# Patient Record
Sex: Male | Born: 1975 | Race: White | Hispanic: No | Smoking: Current every day smoker
Health system: Southern US, Community
[De-identification: ages and names within clinical notes are randomized; demographics above are authoritative.]

## PROBLEM LIST (undated history)

## (undated) DIAGNOSIS — F4310 Post-traumatic stress disorder, unspecified: Secondary | ICD-10-CM

## (undated) DIAGNOSIS — I10 Essential (primary) hypertension: Secondary | ICD-10-CM

## (undated) DIAGNOSIS — K219 Gastro-esophageal reflux disease without esophagitis: Secondary | ICD-10-CM

## (undated) HISTORY — PX: ELBOW SURGERY: SHX618

---

## 2010-11-25 ENCOUNTER — Emergency Department (HOSPITAL_COMMUNITY)
Admission: EM | Admit: 2010-11-25 | Discharge: 2010-11-25 | Disposition: A | Discharge status: Home / Self Care | Payer: Self-pay | Attending: Emergency Medicine | Admitting: Emergency Medicine

## 2010-11-25 DIAGNOSIS — X500XXA Overexertion from strenuous movement or load, initial encounter: Secondary | ICD-10-CM | POA: Insufficient documentation

## 2010-11-25 DIAGNOSIS — F341 Dysthymic disorder: Secondary | ICD-10-CM | POA: Insufficient documentation

## 2010-11-25 DIAGNOSIS — S335XXA Sprain of ligaments of lumbar spine, initial encounter: Secondary | ICD-10-CM | POA: Insufficient documentation

## 2010-11-25 DIAGNOSIS — M545 Low back pain, unspecified: Secondary | ICD-10-CM | POA: Insufficient documentation

## 2010-11-25 DIAGNOSIS — Z79899 Other long term (current) drug therapy: Secondary | ICD-10-CM | POA: Insufficient documentation

## 2011-11-30 ENCOUNTER — Emergency Department (HOSPITAL_COMMUNITY)
Admission: EM | Admit: 2011-11-30 | Discharge: 2011-11-30 | Disposition: A | Discharge status: Home / Self Care | Payer: Medicaid Other | Attending: Emergency Medicine | Admitting: Emergency Medicine

## 2011-11-30 ENCOUNTER — Emergency Department (HOSPITAL_COMMUNITY): Payer: Medicaid Other

## 2011-11-30 ENCOUNTER — Other Ambulatory Visit: Payer: Self-pay

## 2011-11-30 ENCOUNTER — Encounter (HOSPITAL_COMMUNITY): Payer: Self-pay

## 2011-11-30 DIAGNOSIS — Z79899 Other long term (current) drug therapy: Secondary | ICD-10-CM | POA: Insufficient documentation

## 2011-11-30 DIAGNOSIS — R4182 Altered mental status, unspecified: Secondary | ICD-10-CM | POA: Insufficient documentation

## 2011-11-30 DIAGNOSIS — F22 Delusional disorders: Secondary | ICD-10-CM | POA: Insufficient documentation

## 2011-11-30 LAB — RAPID URINE DRUG SCREEN, HOSP PERFORMED
Amphetamines: NOT DETECTED
Barbiturates: NOT DETECTED
Benzodiazepines: NOT DETECTED
Cocaine: NOT DETECTED
Opiates: NOT DETECTED
Tetrahydrocannabinol: NOT DETECTED

## 2011-11-30 LAB — CBC
HCT: 44.3 % (ref 39.0–52.0)
Hemoglobin: 15.5 g/dL (ref 13.0–17.0)
MCH: 33.8 pg (ref 26.0–34.0)
MCHC: 35 g/dL (ref 30.0–36.0)
MCV: 96.7 fL (ref 78.0–100.0)
Platelets: 177 K/uL (ref 150–400)
RBC: 4.58 MIL/uL (ref 4.22–5.81)
RDW: 12.8 % (ref 11.5–15.5)
WBC: 7.4 K/uL (ref 4.0–10.5)

## 2011-11-30 LAB — DIFFERENTIAL
Basophils Absolute: 0 10*3/uL (ref 0.0–0.1)
Basophils Relative: 0 % (ref 0–1)
Neutro Abs: 4.4 10*3/uL (ref 1.7–7.7)
Neutrophils Relative %: 59 % (ref 43–77)

## 2011-11-30 LAB — ETHANOL: Alcohol, Ethyl (B): 223 mg/dL — ABNORMAL HIGH (ref 0–11)

## 2011-11-30 LAB — COMPREHENSIVE METABOLIC PANEL
ALT: 57 U/L — ABNORMAL HIGH (ref 0–53)
AST: 60 U/L — ABNORMAL HIGH (ref 0–37)
Albumin: 4.3 g/dL (ref 3.5–5.2)
Alkaline Phosphatase: 116 U/L (ref 39–117)
Chloride: 105 mEq/L (ref 96–112)
Potassium: 4 mEq/L (ref 3.5–5.1)
Total Bilirubin: 0.2 mg/dL — ABNORMAL LOW (ref 0.3–1.2)

## 2011-11-30 LAB — AMMONIA: Ammonia: 28 umol/L (ref 11–60)

## 2011-11-30 LAB — URINALYSIS, ROUTINE W REFLEX MICROSCOPIC
Glucose, UA: NEGATIVE mg/dL
Hgb urine dipstick: NEGATIVE
Ketones, ur: NEGATIVE mg/dL
Protein, ur: NEGATIVE mg/dL

## 2011-11-30 LAB — ACETAMINOPHEN LEVEL: Acetaminophen (Tylenol), Serum: <15 ug/mL (ref 10–30)

## 2011-11-30 LAB — LACTIC ACID, PLASMA: Lactic Acid, Venous: 1.5 mmol/L (ref 0.5–2.2)

## 2011-11-30 MED ORDER — PANTOPRAZOLE SODIUM 40 MG PO TBEC
40.0000 mg | DELAYED_RELEASE_TABLET | Freq: Every day | ORAL | Status: DC
  Filled 2011-11-30: qty 1

## 2011-11-30 MED ORDER — ADULT MULTIVITAMIN W/MINERALS CH
1.0000 | ORAL_TABLET | Freq: Every day | ORAL | Status: DC
  Administered 2011-11-30: 1 via ORAL
  Filled 2011-11-30: qty 1

## 2011-11-30 MED ORDER — LORAZEPAM 1 MG PO TABS
1.0000 mg | ORAL_TABLET | Freq: Four times a day (QID) | ORAL | Status: DC | PRN
  Administered 2011-11-30 (×2): 1 mg via ORAL
  Filled 2011-11-30: qty 1

## 2011-11-30 MED ORDER — LORAZEPAM 2 MG/ML IJ SOLN: 1.0000 mg | Freq: Four times a day (QID) | INTRAMUSCULAR | Status: DC | PRN

## 2011-11-30 MED ORDER — FOLIC ACID 1 MG PO TABS
1.0000 mg | ORAL_TABLET | Freq: Every day | ORAL | Status: DC
  Administered 2011-11-30: 1 mg via ORAL
  Filled 2011-11-30: qty 1

## 2011-11-30 MED ORDER — ONDANSETRON HCL 8 MG PO TABS: 4.0000 mg | ORAL_TABLET | Freq: Three times a day (TID) | ORAL | Status: DC | PRN

## 2011-11-30 MED ORDER — IBUPROFEN 200 MG PO TABS: 600.0000 mg | ORAL_TABLET | Freq: Three times a day (TID) | ORAL | Status: DC | PRN

## 2011-11-30 MED ORDER — PAROXETINE HCL 20 MG PO TABS
20.0000 mg | ORAL_TABLET | Freq: Two times a day (BID) | ORAL | Status: DC
  Administered 2011-11-30 (×2): 20 mg via ORAL
  Filled 2011-11-30 (×2): qty 1

## 2011-11-30 MED ORDER — ATENOLOL 100 MG PO TABS
100.0000 mg | ORAL_TABLET | Freq: Every day | ORAL | Status: DC
  Administered 2011-11-30: 100 mg via ORAL
  Filled 2011-11-30: qty 1

## 2011-11-30 MED ORDER — VITAMIN B-1 100 MG PO TABS
100.0000 mg | ORAL_TABLET | Freq: Every day | ORAL | Status: DC
  Administered 2011-11-30: 100 mg via ORAL
  Filled 2011-11-30: qty 1

## 2011-11-30 MED ORDER — THIAMINE HCL 100 MG/ML IJ SOLN: 100.0000 mg | Freq: Every day | INTRAMUSCULAR | Status: DC

## 2011-11-30 MED ORDER — CLONAZEPAM 0.5 MG PO TABS
0.5000 mg | ORAL_TABLET | Freq: Three times a day (TID) | ORAL | Status: DC
  Administered 2011-11-30: 0.5 mg via ORAL
  Filled 2011-11-30: qty 1

## 2011-11-30 MED ORDER — LORAZEPAM 1 MG PO TABS: 0.0000 mg | ORAL_TABLET | Freq: Two times a day (BID) | ORAL | Status: DC

## 2011-11-30 MED ORDER — LORAZEPAM 1 MG PO TABS
0.0000 mg | ORAL_TABLET | Freq: Four times a day (QID) | ORAL | Status: DC
  Administered 2011-11-30: 2 mg via ORAL
  Administered 2011-11-30: 1 mg via ORAL
  Filled 2011-11-30: qty 2

## 2011-11-30 MED ORDER — NICOTINE 21 MG/24HR TD PT24
21.0000 mg | MEDICATED_PATCH | Freq: Once | TRANSDERMAL | Status: DC
  Administered 2011-11-30: 21 mg via TRANSDERMAL
  Filled 2011-11-30: qty 1

## 2011-11-30 MED ORDER — LORAZEPAM 1 MG PO TABS: 1.0000 mg | ORAL_TABLET | Freq: Three times a day (TID) | ORAL | Status: DC | PRN

## 2011-11-30 NOTE — ED Notes (Signed)
Pt very agitated, stating that he wants to leave AMA and continues to pace around the room.  Sitter at bedside.  Pt and family offered drinks which I took to them.  Offered pt Ativan which he readily accepted.
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
## 2011-11-30 NOTE — ED Notes (Signed)
Pt presents with 2 month h/o "reality" differences.  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  Pt denies any hallucinations.  Pt denies any SI/HI but fiancee reports on Tuesday, pt took a handful of his pills reports he'd be better off dead.

## 2011-11-30 NOTE — ED Provider Notes (Addendum)
BP 138/81  Pulse 112  Temp(Src) 98.2 F (36.8 C) (Oral)  Resp 16  Ht 6' (1.829 m)  Wt 218 lb (98.884 kg)  BMI 29.57 kg/m2  SpO2 92%  Reviewed telepsych. Recommend discharge home. Continue clonazepam and paxil as prescribed. Will recheck vitals prior to discharge.  ACT to give outpatient resources.  Forbes Cellar, MD 11/30/11 2131  BP 134/87  Pulse 86  Temp(Src) 97.6 F (36.4 C) (Oral)  Resp 20  Ht 6' (1.829 m)  Wt 218 lb (98.884 kg)  BMI 29.57 kg/m2  SpO2 100%   Forbes Cellar, MD 11/30/11 2159

## 2011-11-30 NOTE — ED Provider Notes (Signed)
History     CSN: [id number]  Arrival date & time 11/30/11  1157   First MD Initiated Contact with Patient 11/30/11 1245      Chief Complaint  Patient presents with  . 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 Patient denies any hallucinations, hearing voices, SI or HI. He has a history of anxiety, depression and PTSD and sees a Veterinary surgeon. Takes Paxil and Klonopin. He denies any street drug use. He takes alcohol daily last drink just before coming in. He does get shaky if he does not drink. He denies any chest pain, shortness of breath, abdominal pain, nausea vomiting or fever. Is alert and oriented x3. The patient's fianc reports that he try to overdose on a handful of pills 2 days ago.  The history is provided by the patient and the spouse.    History reviewed. No pertinent past medical history.  Past Surgical History  Procedure Date  . Elbow surgery     No family history on file.  History  Substance Use Topics  . Smoking status: Current Everyday Smoker -- 1.0 packs/day  . Smokeless tobacco: Not on file  . Alcohol Use: Yes      Review of Systems  Constitutional: Positive for activity change and appetite change. Negative for fever.  HENT: Negative for congestion and rhinorrhea.   Eyes: Negative for visual disturbance.  Respiratory: Negative for cough and shortness of breath.   Cardiovascular: Negative for chest pain.  Gastrointestinal: Negative for nausea, vomiting and abdominal pain.  Genitourinary: Negative for dysuria and hematuria.  Musculoskeletal: Negative for back pain.  Neurological: Negative for dizziness, weakness and headaches.  Psychiatric/Behavioral: Positive for suicidal ideas, behavioral problems, confusion, sleep  disturbance, decreased concentration and altered mental status. The patient is nervous/anxious.     Allergies  Review of patient's allergies indicates no known allergies.  Home Medications   Current Outpatient Rx  Name Route Sig Dispense Refill  . ATENOLOL 100 MG PO TABS Oral Take 100 mg by mouth daily.    Marland Kitchen CLONAZEPAM 0.5 MG PO TABS Oral Take 0.5 mg by mouth 3 (three) times daily.    . CYCLOBENZAPRINE HCL 10 MG PO TABS Oral Take 10 mg by mouth daily.    Marland Kitchen OMEPRAZOLE 20 MG PO CPDR Oral Take 20 mg by mouth daily.    Marland Kitchen PAROXETINE HCL 20 MG PO TABS Oral Take 20 mg by mouth 2 (two) times daily.      BP 134/87  Pulse 86  Temp(Src) 97.6 F (36.4 C) (Oral)  Resp 20  Ht 6' (1.829 m)  Wt 218 lb (98.884 kg)  BMI 29.57 kg/m2  SpO2 100%  Physical Exam  Constitutional: He is oriented to person, place, and time. He appears well-developed and well-nourished. No distress.  HENT:  Head: Normocephalic and atraumatic.  Mouth/Throat: Oropharynx is clear and moist. No oropharyngeal exudate.  Eyes: Conjunctivae are normal. Pupils are equal, round, and reactive to light.  Neck: Normal range of motion. Neck supple.       No meningismus  Cardiovascular: Normal rate, regular rhythm and normal heart sounds.   Pulmonary/Chest: Effort normal. No respiratory distress.  Abdominal: Soft. There is no tenderness. There is no rebound and no guarding.  Musculoskeletal: Normal range of motion. He exhibits no edema and no tenderness.  Neurological: He is alert and  oriented to person, place, and time. No cranial nerve deficit.  Skin: Skin is warm.    ED Course  Procedures (including critical care time)  Labs Reviewed  COMPREHENSIVE METABOLIC PANEL - Abnormal; Notable for the following:    AST 60 (*)    ALT 57 (*)    Total Bilirubin 0.2 (*)    All other components within normal limits  ETHANOL - Abnormal; Notable for the following:    Alcohol, Ethyl (B) 223 (*)    All other components within normal  limits  SALICYLATE LEVEL - Abnormal; Notable for the following:    Salicylate Lvl <2.0 (*)    All other components within normal limits  CBC  DIFFERENTIAL  URINALYSIS, ROUTINE W REFLEX MICROSCOPIC  URINE RAPID DRUG SCREEN (HOSP PERFORMED)  AMMONIA  LACTIC ACID, PLASMA  ACETAMINOPHEN LEVEL  LAB REPORT - SCANNED   Ct Head Wo Contrast  11/30/2011  *RADIOLOGY REPORT*  Clinical Data: Altered mental status  CT HEAD WITHOUT CONTRAST  Technique:  Contiguous axial images were obtained from the base of the skull through the vertex without contrast.  Comparison: None  Findings: Prominent cisterna magna, normal variant. Normal ventricular morphology. No midline shift or mass effect. Otherwise normal appearance of brain parenchyma. No intracranial hemorrhage, mass lesion or evidence of acute infarction. No extra-axial fluid collections. Visualized paranasal sinuses and mastoid air cells clear. No acute osseous findings.  IMPRESSION: No acute intracranial abnormalities.  Original Report Authenticated By: Lollie Marrow, M.D.     1. Delusions       MDM  Mental status change with hallucinations, delusions. No SI or HI. Alcohol abuse, PTSD, recent suicidal gesture. Medical workup without etiology of symptoms. ACT team and telepsych consult.     Date: 11/30/2011  Rate: 93  Rhythm: normal sinus rhythm  QRS Axis: normal  Intervals: normal  ST/T Wave abnormalities: normal  Conduction Disutrbances:none  Narrative Interpretation: LVH, QTc normal  Old EKG Reviewed: none available      Glynn Octave, MD 12/01/11 1157
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
## 2011-11-30 NOTE — ED Notes (Signed)
Pt and family are both expressing aggravation at the fact that they have been here for 5 hours and are still awaiting video conference via telepsyche.  Spoke with the ACT team member Belenda Cruise, who advised that it should occur shortly and the  computer was moved to the pt['s room.

## 2011-11-30 NOTE — ED Notes (Signed)
Dinner tray ordered, regular nonsharp 

## 2011-11-30 NOTE — Discharge Instructions (Signed)
Take your medications as prescribed. Follow up with your doctors as discussed.  RESOURCE GUIDE  Dental Problems  Patients with Medicaid: [facility]                     [facility] [address]                                           [address] Phone:  [phone]                                                   Phone:  [phone]  If unable to pay or uninsured, contact:  [facility] or [facility] to become qualified for the adult dental clinic.  Chronic Pain Problems Contact [facility]  [phone] Patients need to be referred by their primary care doctor.  Insufficient Money for Medicine Contact [employer]:  call "211" or [facility] 271-5999.  No Primary Care Doctor Call [facility]  [phone] Other agencies that provide inexpensive medical care    [facility]  [phone]    [facility]  [phone]    [facility]  [phone]    [facility]  [phone]    [facility]  [phone]    [facility]  [phone]  Psychological Services [facility]  [phone] [facility]  [phone] [facility]   [phone] (emergency services 641-4993)  Abuse/Neglect [place] Child Abuse Hotline (336) 641-3795 [place] Child Abuse Hotline 800-378-5315 (After Hours)  Emergency Shelter [facility] (336) 271-5985  [facility] (336) 275-9566 [facility] (704) 372-4663  MRSA Hotline #:   832-7006    [place] [facility]  [facility] [place]  [employer]                           [facility] [address] [place]                     [address]         [address]  [place]                                               [place]                              [place] Phone:  [phone]                                   Phone:  [phone]                   Phone:  [phone]  [facility] Phone:  [phone]  [place] Child Abuse Hotline (336) 342-1394 (336) 342-3537 (After Hours)  

## 2011-11-30 NOTE — ED Notes (Signed)
Spoke with Candy, House Coverage and she is informed about this pt

## 2011-11-30 NOTE — ED Notes (Signed)
Pt is A/O x4, NAD and no vernal complaints at this time.

## 2011-11-30 NOTE — ED Notes (Signed)
Dinner trays delivered

## 2011-11-30 NOTE — ED Notes (Signed)
Patient states that he is here because his girlfriend and his mother brought him to the ED.  Pt denies any suicidal thoughts at this time. Pt denies any homicidal thoughts.  Pt is cooperative with RN and conversing.  Pt states that he does drink ETOH every day. Patient states that she drinks a 6 pack of beer every day.  Last drink was this morning prior to arrival.

## 2012-01-23 ENCOUNTER — Emergency Department (HOSPITAL_COMMUNITY)
Admission: EM | Admit: 2012-01-23 | Discharge: 2012-01-24 | Disposition: A | Discharge status: Home / Self Care | Payer: Medicaid Other | Attending: Emergency Medicine | Admitting: Emergency Medicine

## 2012-01-23 ENCOUNTER — Emergency Department (HOSPITAL_COMMUNITY): Payer: Medicaid Other

## 2012-01-23 ENCOUNTER — Encounter (HOSPITAL_COMMUNITY): Payer: Self-pay | Admitting: Emergency Medicine

## 2012-01-23 DIAGNOSIS — F17200 Nicotine dependence, unspecified, uncomplicated: Secondary | ICD-10-CM | POA: Insufficient documentation

## 2012-01-23 DIAGNOSIS — IMO0002 Reserved for concepts with insufficient information to code with codable children: Secondary | ICD-10-CM | POA: Insufficient documentation

## 2012-01-23 DIAGNOSIS — I10 Essential (primary) hypertension: Secondary | ICD-10-CM | POA: Insufficient documentation

## 2012-01-23 DIAGNOSIS — S2239XA Fracture of one rib, unspecified side, initial encounter for closed fracture: Secondary | ICD-10-CM | POA: Insufficient documentation

## 2012-01-23 DIAGNOSIS — R079 Chest pain, unspecified: Secondary | ICD-10-CM | POA: Insufficient documentation

## 2012-01-23 HISTORY — DX: Gastro-esophageal reflux disease without esophagitis: K21.9

## 2012-01-23 HISTORY — DX: Essential (primary) hypertension: I10

## 2012-01-23 HISTORY — DX: Post-traumatic stress disorder, unspecified: F43.10
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
## 2012-01-24 MED ORDER — HYDROCODONE-ACETAMINOPHEN 5-325 MG PO TABS
1.0000 | ORAL_TABLET | Freq: Once | ORAL | Status: AC
  Administered 2012-01-24: 1 via ORAL
  Filled 2012-01-24: qty 1

## 2012-01-24 MED ORDER — HYDROCODONE-ACETAMINOPHEN 5-500 MG PO TABS: 1.0000 | ORAL_TABLET | Freq: Four times a day (QID) | ORAL | Status: AC | PRN

## 2012-01-24 NOTE — ED Provider Notes (Signed)
Medical screening examination/treatment/procedure(s) were performed by non-physician practitioner and as supervising physician I was immediately available for consultation/collaboration.    Vida Roller, MD 01/24/12 [phone]
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
## 2012-01-24 NOTE — Discharge Instructions (Signed)
Rib Fracture Your caregiver has diagnosed you as having a rib fracture (a break). This can occur by a blow to the chest, by a fall against a hard object, or by violent coughing or sneezing. There may be one or many breaks. Rib fractures may heal on their own within 3 to 8 weeks. The longer healing period is usually associated with a continued cough or other aggravating activities. HOME CARE INSTRUCTIONS   Avoid strenuous activity. Be careful during activities and avoid bumping the injured rib. Activities that cause pain pull on the fracture site(s) and are best avoided if possible.   Eat a normal, well-balanced diet. Drink plenty of fluids to avoid constipation.   Take deep breaths several times a day to keep lungs free of infection. Try to cough several times a day, splinting the injured area with a pillow. This will help prevent pneumonia.   Do not wear a rib belt or binder. These restrict breathing which can lead to pneumonia.   Only take over-the-counter or prescription medicines for pain, discomfort, or fever as directed by your caregiver.  SEEK MEDICAL CARE IF:  You develop a continual cough, associated with thick or bloody sputum. SEEK IMMEDIATE MEDICAL CARE IF:   You have a fever.   You have difficulty breathing.   You have nausea (feeling sick to your stomach), vomiting, or abdominal (belly) pain.   You have worsening pain, not controlled with medications.  Document Released: 08/14/2005 Document Revised: 08/03/2011 Document Reviewed: 01/16/2007 [facility] Patient Information 2012 [place], [place]. You have anterior left sixth rib fracture that is not displaced You have  been given pain medication, as well as an incentive spirometer.  Please use this as instructed

## 2013-04-18 IMAGING — CT CT HEAD W/O CM
1 of 2 series · 13 of 30 positions shown, 17 images · non-contrast
Comparison: None

CLINICAL DATA: Altered mental status

CT HEAD WITHOUT CONTRAST
TECHNIQUE: Contiguous axial images were obtained from the base of
the skull through the vertex without contrast.

[Series 2: brain · axial · 0.47mm/px · z∈[+155,+277]mm · 13 of 28 slices shown, 17 images]
[im 2/28  brain]
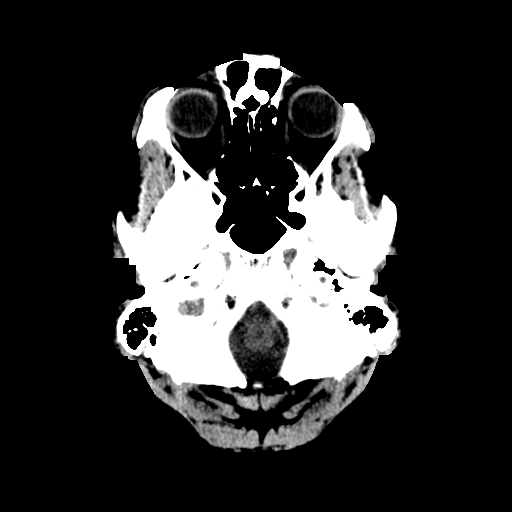
[im 2/28  bone]
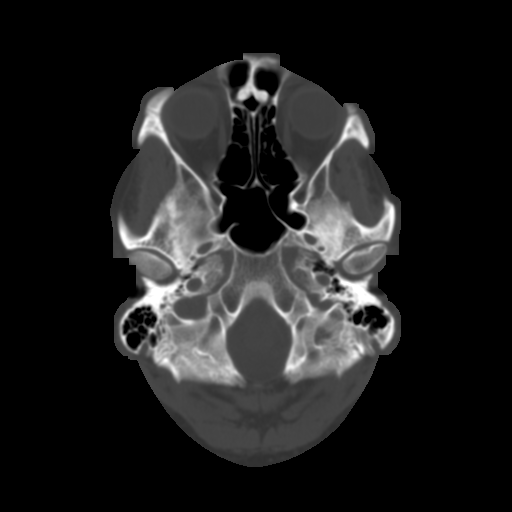
[im 4/28  brain]
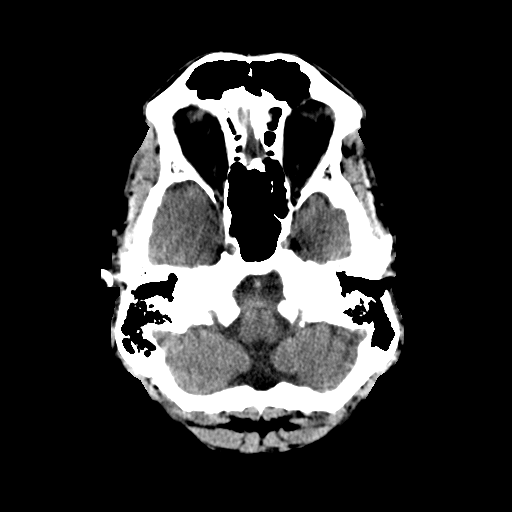
[im 6/28  brain]
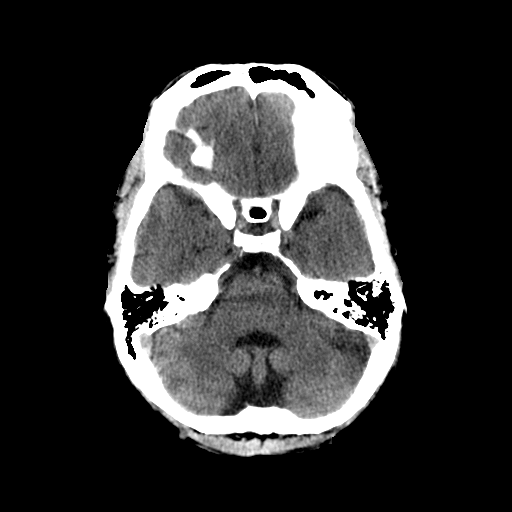
[im 8/28  brain]
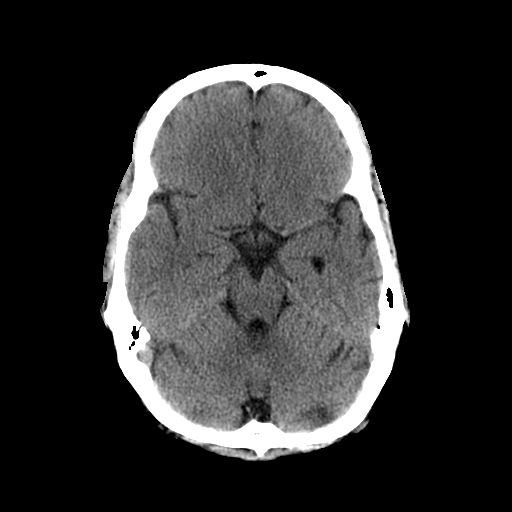
[im 10/28  brain]
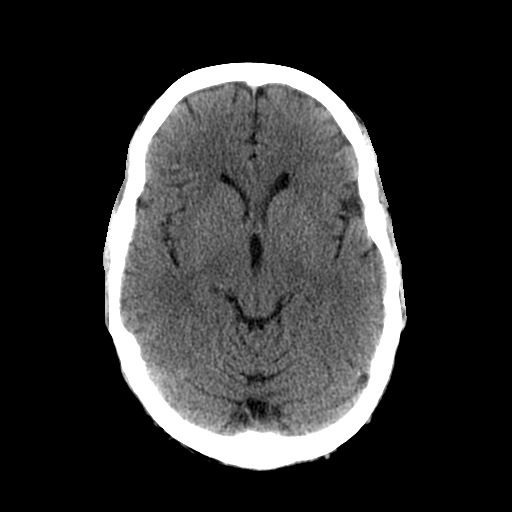
[im 10/28  bone]
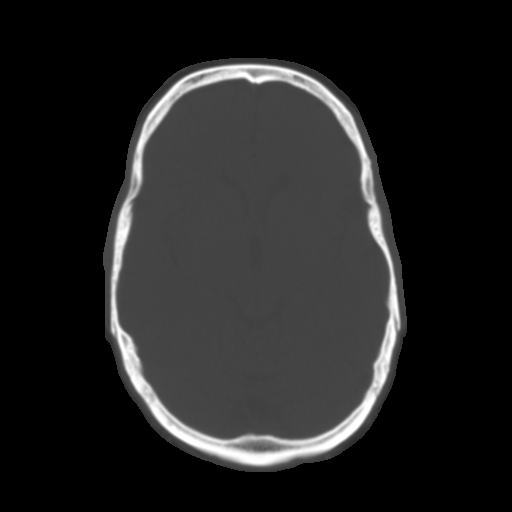
[im 12/28  brain]
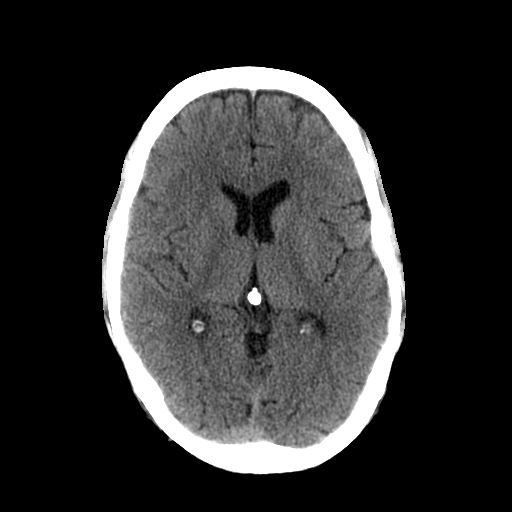
[im 14/28  brain]
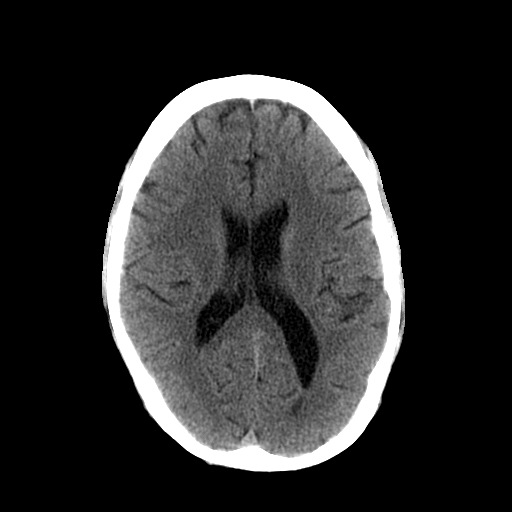
[im 16/28  brain]
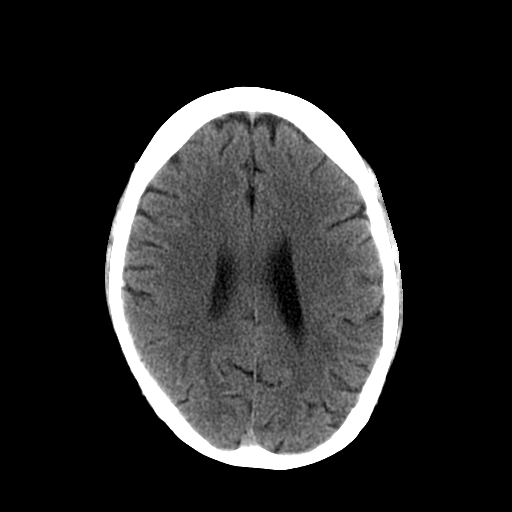
[im 18/28  brain]
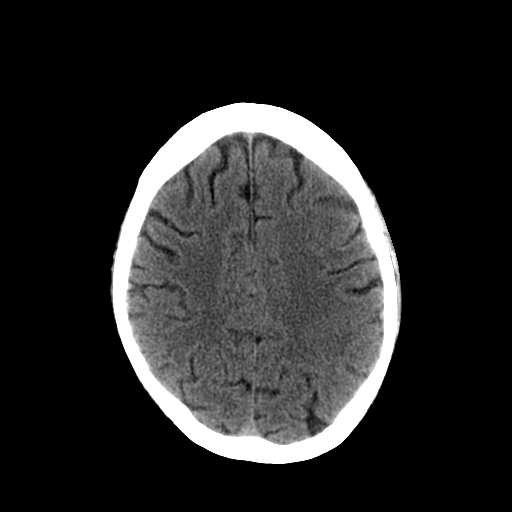
[im 18/28  bone]
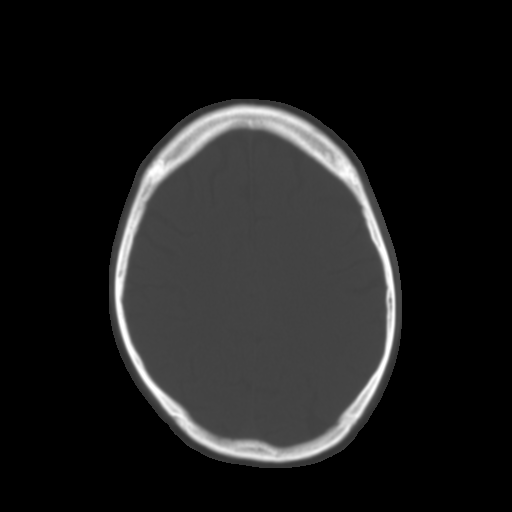
[im 20/28  brain]
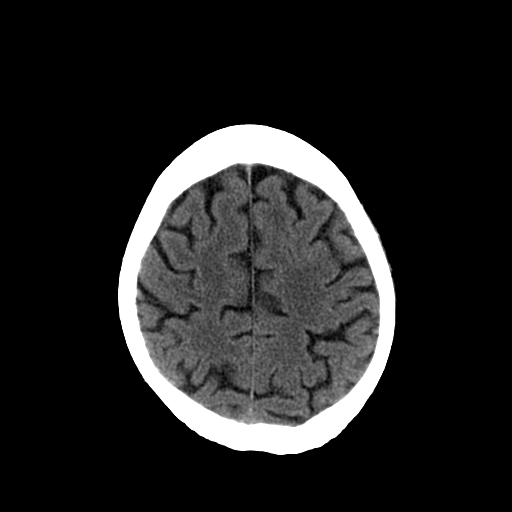
[im 22/28  brain]
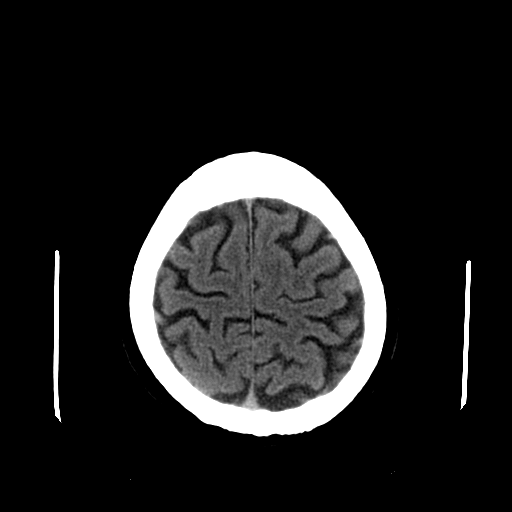
[im 24/28  brain]
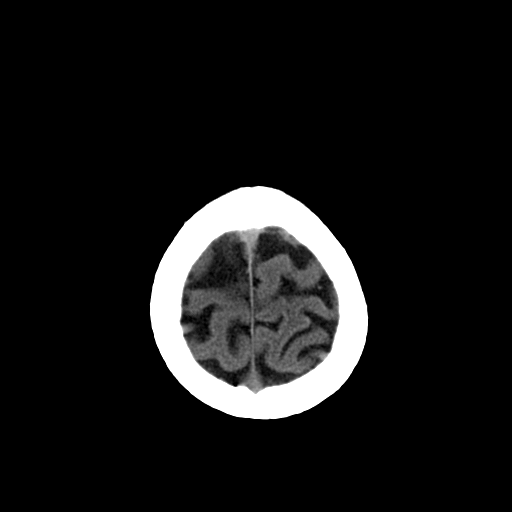
[im 26/28  brain]
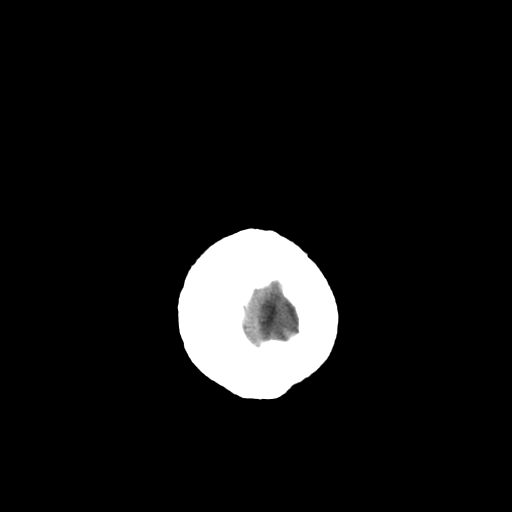
[im 26/28  bone]
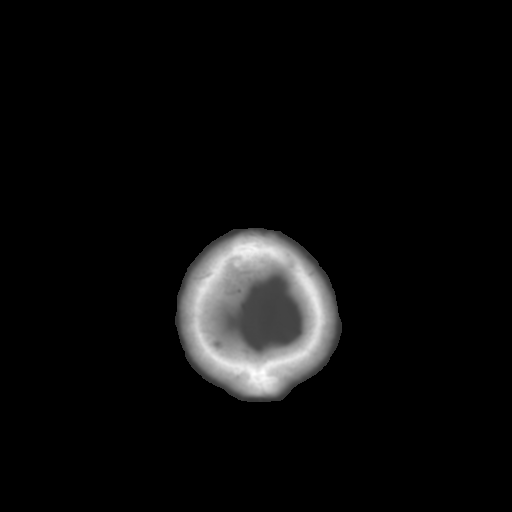

[13 of 30 positions shown; findings below may reference images not displayed]

FINDINGS: Prominent cisterna magna, normal variant.
Normal ventricular morphology.
No midline shift or mass effect.
Otherwise normal appearance of brain parenchyma.
No intracranial hemorrhage, mass lesion or evidence of acute
infarction.
No extra-axial fluid collections.
Visualized paranasal sinuses and mastoid air cells clear.
No acute osseous findings.
IMPRESSION: No acute intracranial abnormalities.

## 2013-06-11 IMAGING — CR DG RIBS W/ CHEST 3+V*L*
4 series · 4 of 4 positions shown · non-contrast
Comparison: None.

CLINICAL DATA: Left rib injury.

LEFT RIBS AND CHEST - 3+ VIEW

[w chest pa]
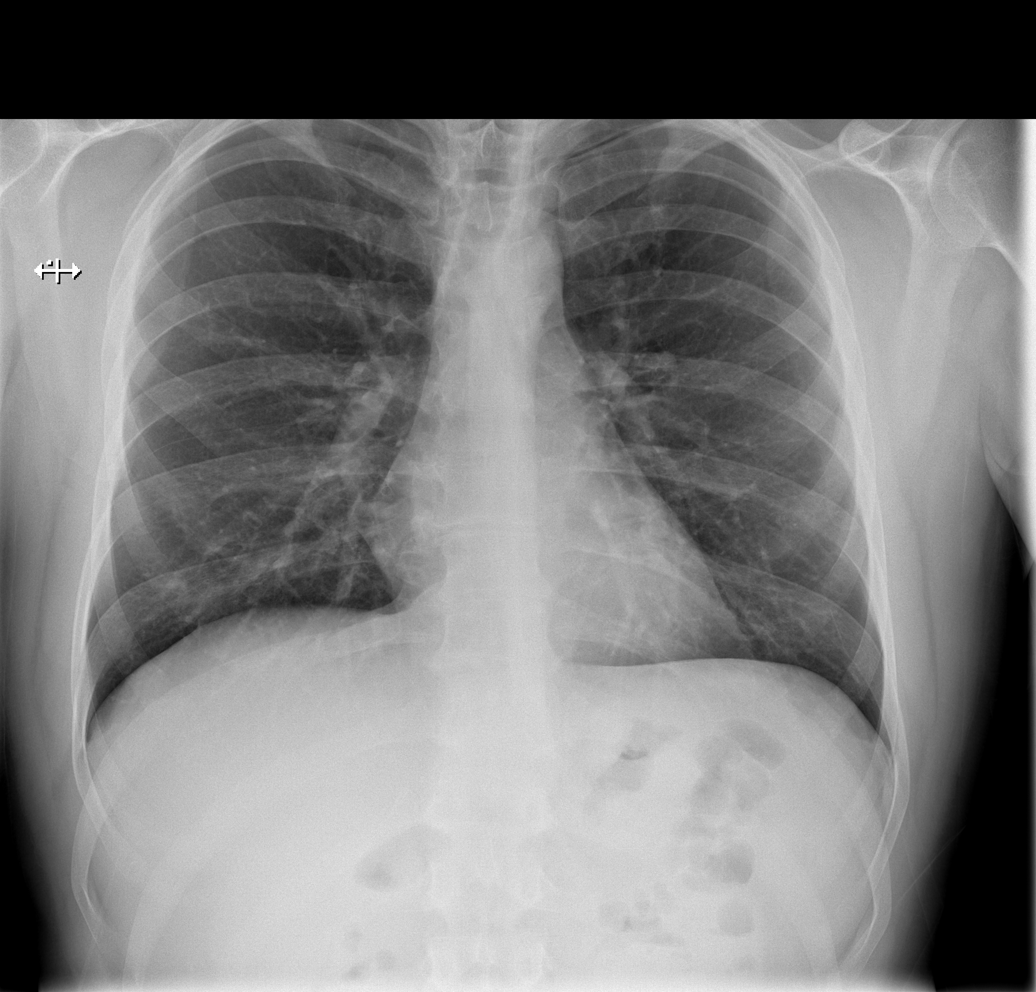

[w ribs ap upper left]
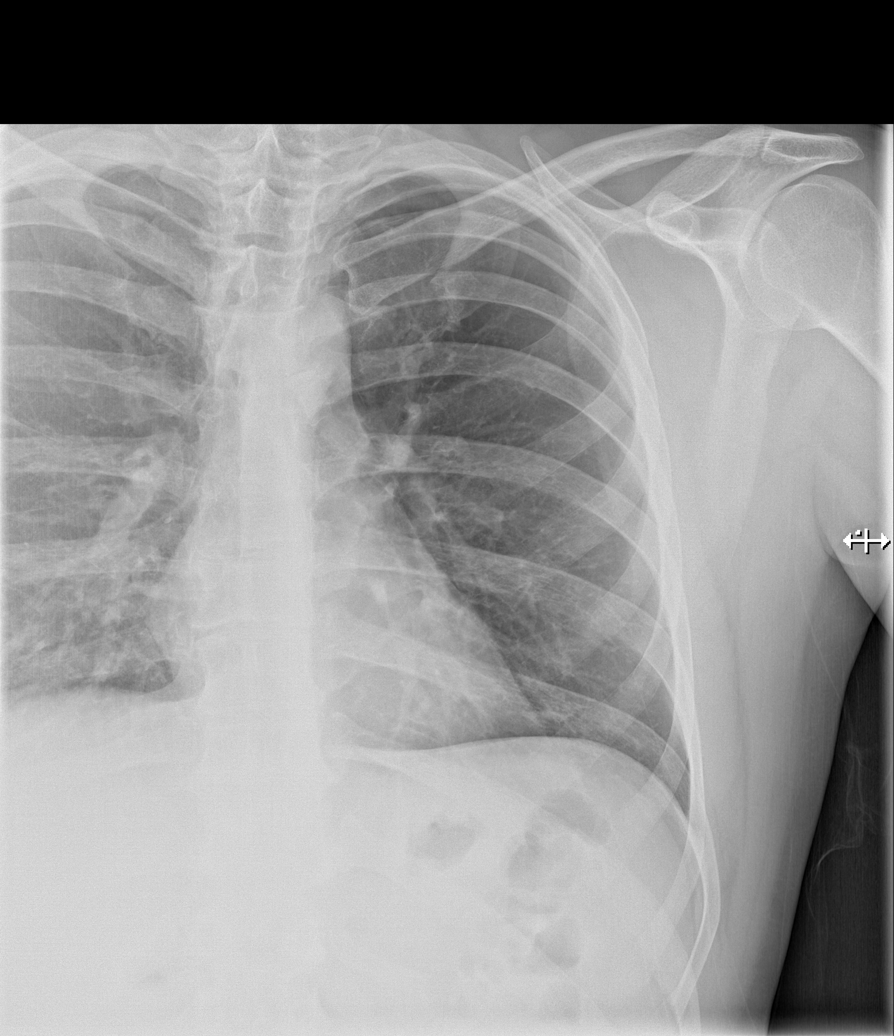

[w ribs ap lower left]
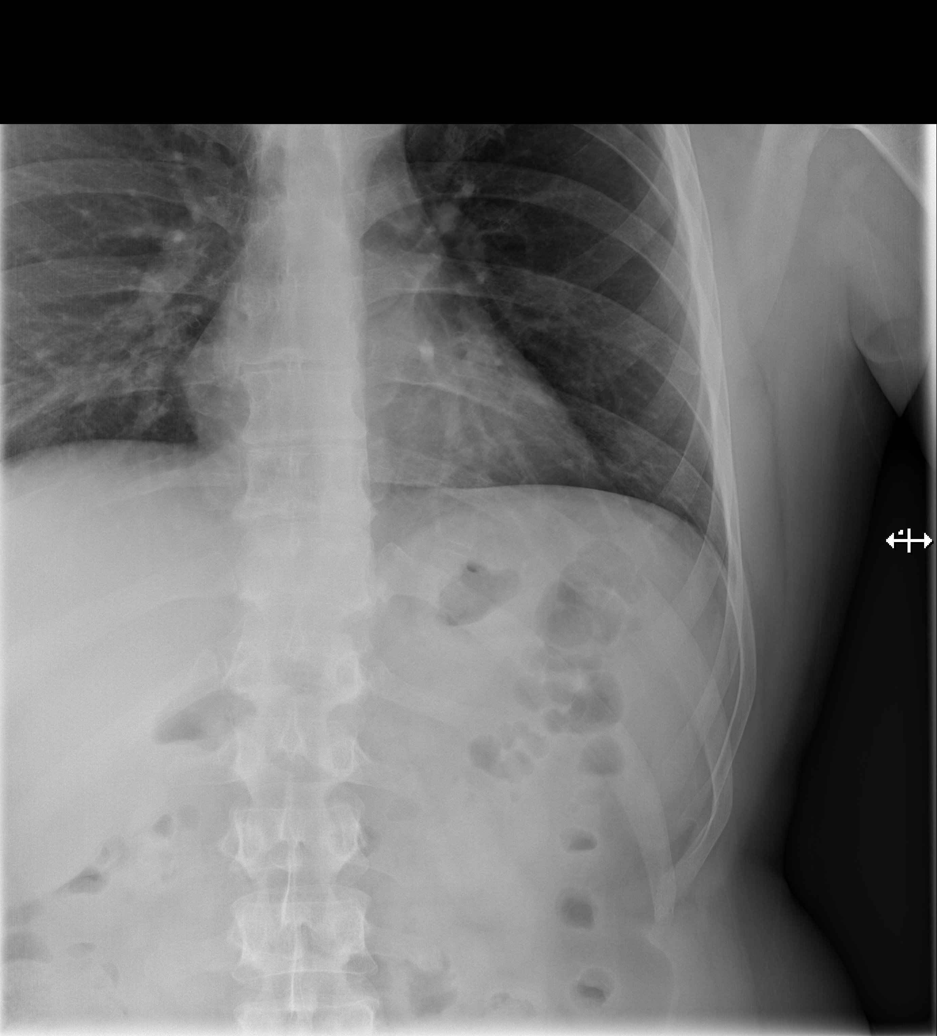

[w ribs obl left]
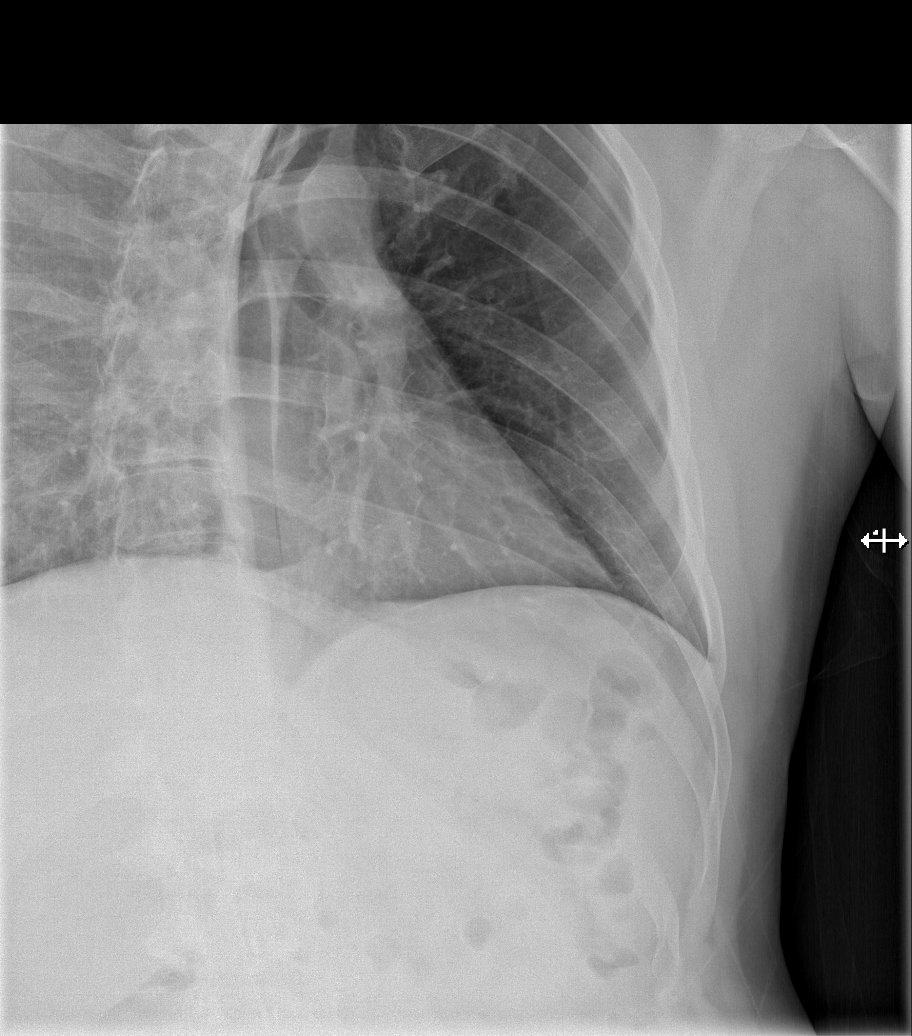

[4 of 4 positions shown; findings below may reference images not displayed]

FINDINGS: The heart size and pulmonary vascularity are normal. The
lungs appear clear and expanded without focal air space disease or
consolidation. No blunting of the costophrenic angles. No
pneumothorax.

Irregularity of the anterior left sixth rib suggesting nondisplaced
fracture.  No displaced fractures are identified.  No focal bone
lesion or expansile change.
IMPRESSION: No evidence of active pulmonary disease.  Suggestion of
nondisplaced fracture of the anterior left sixth rib.

## 2017-05-09 ENCOUNTER — Inpatient Hospital Stay (HOSPITAL_COMMUNITY)
Admission: AD | Admit: 2017-05-09 | Discharge: 2017-05-14 | DRG: 885 | Disposition: A | Discharge status: Home / Self Care | Payer: Federal, State, Local not specified - Other | Source: Intra-hospital | Attending: Psychiatry | Admitting: Psychiatry

## 2017-05-09 ENCOUNTER — Encounter (HOSPITAL_COMMUNITY): Payer: Self-pay

## 2017-05-09 DIAGNOSIS — F410 Panic disorder [episodic paroxysmal anxiety] without agoraphobia: Secondary | ICD-10-CM | POA: Diagnosis present

## 2017-05-09 DIAGNOSIS — F332 Major depressive disorder, recurrent severe without psychotic features: Principal | ICD-10-CM | POA: Diagnosis present

## 2017-05-09 DIAGNOSIS — Z560 Unemployment, unspecified: Secondary | ICD-10-CM | POA: Diagnosis not present

## 2017-05-09 DIAGNOSIS — F4310 Post-traumatic stress disorder, unspecified: Secondary | ICD-10-CM | POA: Diagnosis present

## 2017-05-09 DIAGNOSIS — K219 Gastro-esophageal reflux disease without esophagitis: Secondary | ICD-10-CM | POA: Diagnosis present

## 2017-05-09 DIAGNOSIS — F1990 Other psychoactive substance use, unspecified, uncomplicated: Secondary | ICD-10-CM

## 2017-05-09 DIAGNOSIS — F17210 Nicotine dependence, cigarettes, uncomplicated: Secondary | ICD-10-CM | POA: Diagnosis not present

## 2017-05-09 DIAGNOSIS — Z23 Encounter for immunization: Secondary | ICD-10-CM | POA: Diagnosis not present

## 2017-05-09 DIAGNOSIS — Z79899 Other long term (current) drug therapy: Secondary | ICD-10-CM

## 2017-05-09 DIAGNOSIS — I10 Essential (primary) hypertension: Secondary | ICD-10-CM | POA: Diagnosis present

## 2017-05-09 DIAGNOSIS — Z590 Homelessness: Secondary | ICD-10-CM | POA: Diagnosis not present

## 2017-05-09 MED ORDER — VITAMIN B-1 100 MG PO TABS
100.0000 mg | ORAL_TABLET | Freq: Every day | ORAL | Status: DC
  Administered 2017-05-10 – 2017-05-14 (×5): 100 mg via ORAL
  Filled 2017-05-09 (×7): qty 1

## 2017-05-09 MED ORDER — NICOTINE 21 MG/24HR TD PT24
21.0000 mg | MEDICATED_PATCH | Freq: Every day | TRANSDERMAL | Status: DC
  Administered 2017-05-09 – 2017-05-14 (×6): 21 mg via TRANSDERMAL
  Filled 2017-05-09 (×9): qty 1

## 2017-05-09 MED ORDER — TRAZODONE HCL 50 MG PO TABS
50.0000 mg | ORAL_TABLET | Freq: Every evening | ORAL | Status: DC | PRN
  Administered 2017-05-09 – 2017-05-11 (×2): 50 mg via ORAL
  Filled 2017-05-09 (×2): qty 1

## 2017-05-09 MED ORDER — MAGNESIUM HYDROXIDE 400 MG/5ML PO SUSP: 30.0000 mL | Freq: Every day | ORAL | Status: DC | PRN

## 2017-05-09 MED ORDER — ONDANSETRON 4 MG PO TBDP: 4.0000 mg | ORAL_TABLET | Freq: Four times a day (QID) | ORAL | Status: AC | PRN

## 2017-05-09 MED ORDER — LORAZEPAM 1 MG PO TABS
1.0000 mg | ORAL_TABLET | Freq: Three times a day (TID) | ORAL | Status: AC
  Administered 2017-05-10 (×3): 1 mg via ORAL
  Filled 2017-05-09 (×3): qty 1

## 2017-05-09 MED ORDER — ENSURE ENLIVE PO LIQD
237.0000 mL | Freq: Two times a day (BID) | ORAL | Status: DC
  Administered 2017-05-09: 237 mL via ORAL

## 2017-05-09 MED ORDER — LORAZEPAM 1 MG PO TABS
1.0000 mg | ORAL_TABLET | Freq: Two times a day (BID) | ORAL | Status: AC
  Administered 2017-05-11 (×2): 1 mg via ORAL
  Filled 2017-05-09 (×2): qty 1

## 2017-05-09 MED ORDER — ATENOLOL 50 MG PO TABS
100.0000 mg | ORAL_TABLET | Freq: Every day | ORAL | Status: DC
  Administered 2017-05-09 – 2017-05-14 (×6): 100 mg via ORAL
  Filled 2017-05-09 (×4): qty 1
  Filled 2017-05-09: qty 14
  Filled 2017-05-09: qty 4
  Filled 2017-05-09 (×3): qty 1

## 2017-05-09 MED ORDER — LORAZEPAM 1 MG PO TABS
1.0000 mg | ORAL_TABLET | Freq: Four times a day (QID) | ORAL | Status: AC
  Administered 2017-05-09 (×2): 1 mg via ORAL
  Filled 2017-05-09 (×2): qty 1

## 2017-05-09 MED ORDER — LORAZEPAM 1 MG PO TABS
1.0000 mg | ORAL_TABLET | Freq: Every day | ORAL | Status: AC
Start: 2017-05-12 — End: 2017-05-12
  Administered 2017-05-12: 1 mg via ORAL
  Filled 2017-05-09: qty 1

## 2017-05-09 MED ORDER — ALUM & MAG HYDROXIDE-SIMETH 200-200-20 MG/5ML PO SUSP: 30.0000 mL | ORAL | Status: DC | PRN

## 2017-05-09 MED ORDER — ADULT MULTIVITAMIN W/MINERALS CH
1.0000 | ORAL_TABLET | Freq: Every day | ORAL | Status: DC
  Administered 2017-05-09 – 2017-05-14 (×6): 1 via ORAL
  Filled 2017-05-09 (×4): qty 1
  Filled 2017-05-09: qty 7
  Filled 2017-05-09 (×3): qty 1

## 2017-05-09 MED ORDER — HYDROXYZINE HCL 25 MG PO TABS
25.0000 mg | ORAL_TABLET | Freq: Four times a day (QID) | ORAL | Status: AC | PRN
  Administered 2017-05-11 – 2017-05-12 (×2): 25 mg via ORAL
  Filled 2017-05-09 (×2): qty 1

## 2017-05-09 MED ORDER — LOPERAMIDE HCL 2 MG PO CAPS: 2.0000 mg | ORAL_CAPSULE | ORAL | Status: AC | PRN

## 2017-05-09 MED ORDER — PAROXETINE HCL 20 MG PO TABS
20.0000 mg | ORAL_TABLET | Freq: Every day | ORAL | Status: DC
  Administered 2017-05-09 – 2017-05-11 (×3): 20 mg via ORAL
  Filled 2017-05-09 (×5): qty 1

## 2017-05-09 MED ORDER — LORAZEPAM 1 MG PO TABS
1.0000 mg | ORAL_TABLET | Freq: Four times a day (QID) | ORAL | Status: AC | PRN
  Administered 2017-05-11: 1 mg via ORAL

## 2017-05-09 MED ORDER — INFLUENZA VAC SPLIT QUAD 0.5 ML IM SUSY
0.5000 mL | PREFILLED_SYRINGE | INTRAMUSCULAR | Status: AC
  Administered 2017-05-10: 0.5 mL via INTRAMUSCULAR
  Filled 2017-05-09: qty 0.5

## 2017-05-09 MED ORDER — ACETAMINOPHEN 325 MG PO TABS
650.0000 mg | ORAL_TABLET | Freq: Four times a day (QID) | ORAL | Status: DC | PRN
  Administered 2017-05-11 – 2017-05-13 (×2): 650 mg via ORAL
  Filled 2017-05-09 (×2): qty 2

## 2017-05-09 MED ORDER — PNEUMOCOCCAL VAC POLYVALENT 25 MCG/0.5ML IJ INJ
0.5000 mL | INJECTION | INTRAMUSCULAR | Status: AC
  Administered 2017-05-10: 0.5 mL via INTRAMUSCULAR

## 2017-05-09 MED ORDER — PANTOPRAZOLE SODIUM 40 MG PO TBEC
40.0000 mg | DELAYED_RELEASE_TABLET | Freq: Every day | ORAL | Status: DC
  Administered 2017-05-09 – 2017-05-14 (×6): 40 mg via ORAL
  Filled 2017-05-09: qty 1
  Filled 2017-05-09: qty 7
  Filled 2017-05-09 (×6): qty 1

## 2017-05-09 NOTE — BH Assessment (Signed)
Tele Assessment Note   Patient Name: Allen PenceJeremy Langone MRN: [id number] Referring Physician: Donell SievertSpencer Simon Location of Patient: BH-300B IP ADULT Location of Provider: Behavioral Health TTS Department  Allen PenceJeremy Mancillas is an 41 y.o. male presented to [facility] with SI thoughts and plan to shoot self. Later reports continued SI but denied plan. Denies access to weapons. 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  The patient reports relationship stressors and financial issues as a primary stressors. States he's going through a separation, reports wife left him for another man. The patient has been living with his mom but they argued yesterday and she asked him to leave. He is unemployed.  [place] in [place] and moved in with his mother who lives in the [place] area. [facility] with Brock BadLinda Greninger but has been non compliant with the medications the last few months. Was on 40 mg of Paxil and 3 mg of Klonopin x3 times a day. Reports daily drinking for an unknown period of time, 2-40oz beers a day. Reports past withdrawal symptoms, during interview the patient had visible tremors. Denies HI or A/V.  Patient was disheveled, had freedom of movement, slurred speech, anxious and sad mood, appropriate affect, panic attacks, coherent thought, impaired judgment, was oriented x4, decreased concentration, poor insight and impulse control. No prior inpatient treatment.   Donell SievertSpencer Simon, NP recommends inpatient treatment. [facility]   Diagnosis: MDD, recurrent severe, without psychosis; Alcohol use disorder  Past Medical History:  Past Medical History:  Diagnosis Date  . GERD (gastroesophageal reflux disease)   . Hypertension   . PTSD (post-traumatic stress disorder)     Past Surgical History:  Procedure Laterality Date  . ELBOW SURGERY      Family History: No family history on file.  Social History:  reports that he has been smoking.  He has  been smoking about 1.00 pack per day. He does not have any smokeless tobacco history on file. He reports that he drinks alcohol. He reports that he does not use drugs.  Additional Social History:  Alcohol / Drug Use Pain Medications: see MAR Prescriptions: see MAR Over the Counter: see MAR History of alcohol / drug use?: Yes Substance #1 Name of Substance 1: alcohol 1 - Age of First Use: UTA 1 - Amount (size/oz): 2 40 oz beers 1 - Frequency: daily 1 - Duration: unknown  1 - Last Use / Amount: 05/08/17, unknown amount, positive ETOH  CIWA:   COWS:    PATIENT STRENGTHS: (choose at least two) Average or above average intelligence General fund of knowledge  Allergies: No Known Allergies  Home Medications:  No prescriptions prior to admission.    OB/GYN Status:  No LMP for male patient.  General Assessment Data Location of Assessment: [facility] Assessment Services TTS Assessment: Out of system Is this a Tele or Face-to-Face Assessment?: Tele Assessment Is this an Initial Assessment or a Re-assessment for this encounter?: Initial Assessment Marital status: Separated Is patient pregnant?: No Pregnancy Status: No Living Arrangements: Parent (with mom but she will not take him back) Can pt return to current living arrangement?: No Admission Status: Voluntary Is patient capable of signing voluntary admission?: Yes Referral Source: Other [facility] [facility] Insurance type: self pay  Medical Screening Exam [facility] Walk-in ONLY) Medical Exam completed: Yes  Crisis Care Plan Living Arrangements: Parent (with mom but she will not take him back) [facility]  Education Status  Is patient currently in school?: No  Risk to self with the past 6 months Suicidal Ideation: Yes-Currently Present Has patient been a risk to self within the past 6 months prior to admission? : Yes Suicidal Intent: Yes-Currently Present Has patient had any suicidal intent  within the past 6 months prior to admission? : Yes Is patient at risk for suicide?: Yes Suicidal Plan?: No Has patient had any suicidal plan within the past 6 months prior to admission? : Yes Specify Current Suicidal Plan: plan to shoot self upon admission Access to Means: No What has been your use of drugs/alcohol within the last 12 months?: alcohol Previous Attempts/Gestures: No How many times?: 0 Intentional Self Injurious Behavior: None Family Suicide History: Unknown Recent stressful life event(s): Conflict (Comment), Financial Problems Persecutory voices/beliefs?: No Depression: Yes Depression Symptoms: Despondent, Tearfulness Substance abuse history and/or treatment for substance abuse?: Yes Suicide prevention information given to non-admitted patients: Not applicable  Risk to Others within the past 6 months Homicidal Ideation: No Does patient have any lifetime risk of violence toward others beyond the six months prior to admission? 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[employer]) Criminal Charges Pending?: No Does patient have a court date: No Is patient on probation?: No  Psychosis Hallucinations: None noted Delusions: None noted  Mental Status Report Appearance/Hygiene: Disheveled Eye Contact:  (n/a) Motor Activity: Freedom of movement, Restlessness Speech: Slurred Level of Consciousness:  (n/a) Mood: Anxious, Sad Affect: Appropriate to circumstance Anxiety Level: Panic Attacks Panic attack frequency: unknown Most recent panic attack: unknown  Thought Processes: Coherent, Relevant Judgement: Impaired Orientation: Person, Place, Time, Situation Obsessive Compulsive Thoughts/Behaviors: None  Cognitive Functioning Concentration:  Decreased Memory: Recent Intact, Remote Intact IQ: Average Insight: Good Impulse Control: Poor Appetite: Poor Weight Loss: 0 Weight Gain: 0 Sleep: Decreased Total Hours of Sleep: 4 Vegetative Symptoms: None  ADLScreening [facility] Assessment Services) Patient's cognitive ability adequate to safely complete daily activities?: Yes Patient able to express need for assistance with ADLs?: Yes Independently performs ADLs?: Yes (appropriate for developmental age)  Prior Inpatient Therapy Prior Inpatient Therapy: No  Prior Outpatient Therapy Prior Outpatient Therapy: Yes Prior Therapy Dates: several months ago Prior Therapy Facilty/Provider(s): [facility] Does patient have an ACCT team?: No Does patient have Intensive In-House Services?  : No Does patient have [facility] services? : Yes Does patient have P4CC services?: No  ADL Screening (condition at time of admission) Patient's cognitive ability adequate to safely complete daily activities?: Yes Is the patient deaf or have difficulty hearing?: No Does the patient have difficulty seeing, even when wearing glasses/contacts?: No Does the patient have difficulty concentrating, remembering, or making decisions?: No Patient able to express need for assistance with ADLs?: Yes Does the patient have difficulty dressing or bathing?: No Independently performs ADLs?: Yes (appropriate for developmental age)       Abuse/Neglect Assessment (Assessment to be complete while patient is alone) Physical Abuse:  (UTA) Verbal Abuse:  (UTA) Sexual Abuse:  (UTA)     Advance Directives (For Healthcare) Does Patient Have a Medical Advance Directive?: No Would patient like information on creating a medical advance directive?: Yes (Inpatient - patient defers creating a medical advance directive at this time)    Additional Information 1:1 In Past 12 Months?: No CIRT Risk:  No Elopement Risk: No Does patient have medical clearance?: Yes     Disposition:   Disposition Initial Assessment Completed for this Encounter: Yes Disposition of Patient: Inpatient treatment program Type of inpatient treatment program: Adult  This service was provided via telemedicine using a 2-way, interactive audio and video technology.     Vonzell Schlatter Rasean Joos 05/09/2017 2:01 PM

## 2017-05-09 NOTE — Progress Notes (Signed)
Nursing Progress Note 1900-0730  D) Patient presents with anxious/depressed mood/affect. Patient is calm and cooperative on the unit. VS obtained, BP mildly elevated; patient asymptomatic. Patient with mild anxiety and tremors, CIWA score currently 4. Patient received his scheduled ativan per protocol. Patient complains of insomnia and requests trazodone for sleep. Patient denies SI/HI/AVH or pain and contracts for safety on the unit. Patient was pleasant with Clinical research associatewriter but isolative on the unit. Patient did not attend group this evening. Patient denies current questions or concerns and reports he is adjusting well to the unit.  A) Emotional support given. 1:1 interaction and active listening provided. Patient medicated as prescribed. Medications and plan of care reviewed with patient. Patient verbalized understanding without further questions. Snacks and fluids provided. Opportunities for questions or concerns presented to patient. Patient encouraged to continue to work on treatment goals. Labs, vital signs and patient behavior monitored throughout shift. Patient safety maintained with q15 min safety checks. Low fall risk precautions in place and reviewed with patient; patient verbalized understanding.  R) Patient receptive to interaction with nurse. Patient remains safe on the unit at this time. Patient denies any adverse medication reactions at this time. Patient is resting in bed without complaints. Will continue to monitor.

## 2017-05-09 NOTE — Plan of Care (Signed)
Problem: Safety: Goal: Periods of time without injury will increase Outcome: Progressing Patient is on q15 minute safety checks and low fall risk precautions. Patient contracts for safety on the unit and remains safe at this time. Withdrawal symptoms monitored.

## 2017-05-09 NOTE — Progress Notes (Signed)
Psychoeducational Group Note  Date:  05/09/2017 Time:  2319  Group Topic/Focus:  Wrap-Up Group:   The focus of this group is to help patients review their daily goal of treatment and discuss progress on daily workbooks.  Participation Level: Did Not Attend  Participation Quality:  Not Applicable  Affect:  Not Applicable  Cognitive:  Not Applicable  Insight:  Not Applicable  Engagement in Group: Not Applicable  Additional Comments:  The patient did not attend group this evening since he remained in his bedroom.   Hazle CocaGOODMAN, Malayah Demuro S 05/09/2017, 11:18 PM

## 2017-05-09 NOTE — Progress Notes (Signed)
Allen Brown is a 41 year old male being admitted voluntarily to [address] from [facility].  He came to the ED with suicidal ideation and plan to shoot self.  He reported drinking 80 oz of beer daily and drinks until he blacks out most days.  He reported relationship issues, no job, moving back in with his mother (who asked him to leave) and transportation issues.  He also reported that he hasn't been taking his psychiatric medications for "a fe weeks."  He denied HI or A/V hallucinations.  He is diagnosed with Major Depressive Disorder and Alcohol use disorder.  He continues to voice passive SI and will contract for safety on the unit.  He denies any pain or discomfort and appears to be in no physical distress.  He has noticeable tremors and states that it is from the anxiety and not coming from withdrawals.  Oriented him to the unit.  Admission paperwork completed and signed.  Belongings searched and secured in locker # 28.  Skin assessment completed and no skin issues noted.  Q 15 minute checks initiated for safety.  We will monitor the progress towards his goals.

## 2017-05-09 NOTE — Progress Notes (Signed)
Writer introduced self to pt. Medications reviewed and administered to pt. Pt noted to be tremulous on approach. Pt gait steady. Pt denies any withdrawal symptoms. Pt appears to be minimal symptoms. Ativan given per protocol.

## 2017-05-09 NOTE — Tx Team (Signed)
Initial Treatment Plan 05/09/2017 2:18 PM Allen PenceJeremy Kregel QIO:[id number]    PATIENT STRESSORS: Financial difficulties Marital or family conflict Medication change or noncompliance Occupational concerns Substance abuse   PATIENT STRENGTHS: Wellsite geologistCommunication skills General fund of knowledge Physical Health   PATIENT IDENTIFIED PROBLEMS: Depression  Suicidal ideation  Anxiety  Substance abuse  "I don't want to feel like I'm losing my mind"  "Get my alcohol use under control"  "I want help with my anxiety"         DISCHARGE CRITERIA:  Improved stabilization in mood, thinking, and/or behavior Verbal commitment to aftercare and medication compliance Withdrawal symptoms are absent or subacute and managed without 24-hour nursing intervention  PRELIMINARY DISCHARGE PLAN: Outpatient therapy Medication management  PATIENT/FAMILY INVOLVEMENT: This treatment plan has been presented to and reviewed with the patient, Allen PenceJeremy Rovito.  The patient and family have been given the opportunity to ask questions and make suggestions.  Levin BaconHeather V Chattie Greeson, RN 05/09/2017, 2:18 PM

## 2017-05-10 DIAGNOSIS — F332 Major depressive disorder, recurrent severe without psychotic features: Principal | ICD-10-CM

## 2017-05-10 DIAGNOSIS — F410 Panic disorder [episodic paroxysmal anxiety] without agoraphobia: Secondary | ICD-10-CM

## 2017-05-10 DIAGNOSIS — F17210 Nicotine dependence, cigarettes, uncomplicated: Secondary | ICD-10-CM

## 2017-05-10 DIAGNOSIS — Z560 Unemployment, unspecified: Secondary | ICD-10-CM

## 2017-05-10 DIAGNOSIS — F1099 Alcohol use, unspecified with unspecified alcohol-induced disorder: Secondary | ICD-10-CM

## 2017-05-10 DIAGNOSIS — F1990 Other psychoactive substance use, unspecified, uncomplicated: Secondary | ICD-10-CM

## 2017-05-10 DIAGNOSIS — Z590 Homelessness: Secondary | ICD-10-CM

## 2017-05-10 MED ORDER — GABAPENTIN 300 MG PO CAPS
300.0000 mg | ORAL_CAPSULE | Freq: Three times a day (TID) | ORAL | Status: DC
  Administered 2017-05-10 – 2017-05-14 (×12): 300 mg via ORAL
  Filled 2017-05-10 (×5): qty 1
  Filled 2017-05-10: qty 21
  Filled 2017-05-10 (×7): qty 1
  Filled 2017-05-10: qty 21
  Filled 2017-05-10 (×2): qty 1
  Filled 2017-05-10: qty 21

## 2017-05-10 NOTE — [facility] Group Notes (Signed)
LCSW Group Therapy Note  05/10/2017 1:15pm  Type of Therapy/Topic:  Group Therapy:  Balance in Life  Participation Level:  Pt invited. Did not attend.  Allen Brown, [name], LCSWA 05/10/2017 3:59 PM

## 2017-05-10 NOTE — Progress Notes (Signed)
NUTRITION ASSESSMENT  Pt identified as at risk on the Malnutrition Screen Tool  INTERVENTION: 1. Educated patient on the importance of nutrition and encouraged intake of food and beverages. 2. Discussed weight goals. 3. Supplements: will d/c Ensure Enlive at this time.   NUTRITION DIAGNOSIS: Unintentional weight loss related to sub-optimal intake as evidenced by pt report.   Goal: Pt to meet >/= 90% of their estimated nutrition needs.  Monitor:  PO intake  Assessment:   Pt admitted for SI with a plan to shoot himself. He drinks ~80 ounces of beer and drinks until he blacks out most of the time. He recently moved back in with his mother than she does not want him in the home. He is unemployed and does not have a form of transportation. He's been off of his psych medications for several weeks.   No recent weight hx available. Ensure Enlive currently ordered BID but pt refusing supplements so will d/c them. Continue to encourage PO intakes of meals and snacks.    41 y.o. male  Height: Ht Readings from Last 1 Encounters:  05/09/17 6' 1.5" (1.867 m)    Weight: Wt Readings from Last 1 Encounters:  05/09/17 167 lb (75.8 kg)    Weight Hx: Wt Readings from Last 10 Encounters:  05/09/17 167 lb (75.8 kg)  11/30/11 218 lb (98.9 kg)    BMI:  Body mass index is 21.73 kg/m. Pt meets criteria for normal weight based on current BMI.  Estimated Nutritional Needs: Kcal: 25-30 kcal/kg Protein: > 1 gram protein/kg Fluid: 1 ml/kcal  Diet Order: Diet regular Room service appropriate? Yes; Fluid consistency: Thin Pt is also offered choice of unit snacks mid-morning and mid-afternoon.  Pt is eating as desired.   Lab results and medications reviewed.     Trenton GammonJessica Trevaughn Schear, MS, RD, LDN, [facility] Inpatient Clinical Dietitian Pager # [phone] After hours/weekend pager # [phone]

## 2017-05-10 NOTE — H&P (Signed)
Psychiatric Admission Assessment Adult  Patient Identification: Allen PenceJeremy Brown MRN:  [id number] Date of Evaluation:  05/10/2017 Chief Complaint:  Suicidal thoughts Principal Diagnosis: MDD Recurrent                                         Panic Disorder                                         SUD Diagnosis:   Patient Active Problem List   Diagnosis Date Noted  . MDD (major depressive disorder), recurrent severe, without psychosis (HCC) [F33.2] 05/09/2017   History of Present Illness:  41 y.o Caucasian male recently separated, recently lost his job, recently evicted by his mom.  [facility] Presented there via EMS. Called because he was feeling hopeless and worthless. He expressed severe depression and worsening suicidal thoughts. He reports daily use of alcohol.  At interview, patient reports that he has been drinking since his early teens. Says he also takes Clonazepam at daily. Alcohol has caused problems in is marriage. It affected his job. Patient was previously on probation for trespassing. Says he called the ambulance because he felt as if he was "at the end of the rope". Says he has been feeling very depressed. No interest or motivation to do things. Says all he does is drink alcohol. He had stopped taking his antidepressants for the past couple of weeks. Patient says he is not necessarily suicidal. Says he is just tired of the way his life is going. Says he sometime wishes that God would take his life. Patient denies any active suicidal thoughts. Denies any past suicidal behavior. Denies any thoughts of violence. Denies any homicidal thoughts. He has been started of Ativan protocol. No sweatiness, no headaches. No retching, nausea or vomiting. No fullness in the head. No visual, tactile or auditory hallucination. No internal restlessness. Anxiety is not overwhelming. No evidence of PTSD.   Total Time spent with patient: 1 hour  Past Psychiatric History: MDD, SUD and  anxiety disorder. No past inpatient care. Managed in out patient with Paroxetine and Clonazepam. Says he used to be on 60 mg of Paxil in the past. Reports use of 3 mg of Clonazepam TID in the past. Denies any past history of mania. No past history of psychosis. No past history of violent behavior. Denies any past history of suicide. Denies any access to weapons.  Had IOP treatment for SUD in the past. Very limited AA engagement. No past rehab.  Is the patient at risk to self? No.  Has the patient been a risk to self in the past 6 months? No.  Has the patient been a risk to self within the distant past? No.  Is the patient a risk to others? No.  Has the patient been a risk to others in the past 6 months? No.  Has the patient been a risk to others within the distant past? No.   Prior Inpatient Therapy: Prior Inpatient Therapy: No Prior Outpatient Therapy: Prior Outpatient Therapy: Yes Prior Therapy Dates: several months ago Prior Therapy Facilty/Provider(s): [facility] Does patient have an ACCT team?: No Does patient have Intensive In-House Services?  : No Does patient have [facility] services? : Yes Does patient have P4[facility] services?: No  Alcohol Screening: 1. How often  do you have a drink containing alcohol?: 4 or more times a week 2. How many drinks containing alcohol do you have on a typical day when you are drinking?: 10 or more 3. How often do you have six or more drinks on one occasion?: Daily or almost daily Preliminary Score: 8 4. How often during the last year have you found that you were not able to stop drinking once you had started?: Weekly 5. How often during the last year have you failed to do what was normally expected from you becasue of drinking?: Monthly 6. How often during the last year have you needed a first drink in the morning to get yourself going after a heavy drinking session?: Daily or almost daily 7. How often during the last year have you had a feeling of guilt of  remorse after drinking?: Daily or almost daily 8. How often during the last year have you been unable to remember what happened the night before because you had been drinking?: Less than monthly 9. Have you or someone else been injured as a result of your drinking?: No 10. Has a relative or friend or a doctor or another health worker been concerned about your drinking or suggested you cut down?: Yes, during the last year Alcohol Use Disorder Identification Test Final Score (AUDIT): 30 Brief Intervention: Yes Substance Abuse History in the last 12 months:  Yes.   Consequences of Substance Abuse: As above Previous Psychotropic Medications: Yes  Psychological Evaluations: Yes  Past Medical History:  Past Medical History:  Diagnosis Date  . GERD (gastroesophageal reflux disease)   . Hypertension   . PTSD (post-traumatic stress disorder)     Past Surgical History:  Procedure Laterality Date  . ELBOW SURGERY     Family History: History reviewed. No pertinent family history. Family Psychiatric  History: Addiction  Tobacco Screening: Have you used any form of tobacco in the last 30 days? (Cigarettes, Smokeless Tobacco, Cigars, and/or Pipes): Yes Tobacco use, Select all that apply: 5 or more cigarettes per day Are you interested in Tobacco Cessation Medications?: Yes, will notify MD for an order Counseled patient on smoking cessation including recognizing danger situations, developing coping skills and basic information about quitting provided: Refused/Declined practical counseling Social History:  History  Alcohol Use  . Yes     History  Drug Use No    Additional Social History: Marital status: Separated Separated, when?: 3 months  What types of issues is patient dealing with in the relationship?: Wife is having an affair  Are you sexually active?: No What is your sexual orientation?: Heterosexual  Has your sexual activity been affected by drugs, alcohol, medication, or emotional  stress?: No  Does patient have children?: Yes How many children?: 3 How is patient's relationship with their children?: Pt states relationship with oldest child is good, relationship wtih middle child is strained and there is no relationship with younest child because the child was placed for adoption    Pain Medications: see MAR Prescriptions: see MAR Over the Counter: see MAR History of alcohol / drug use?: Yes Name of Substance 1: alcohol 1 - Age of First Use: UTA 1 - Amount (size/oz): 2 40 oz beers 1 - Frequency: daily 1 - Duration: unknown  1 - Last Use / Amount: 05/08/17, unknown amount, positive ETOH      Was married for over ten years. Says they do not have any children together. Patient says he is looking for a new job. No  pending legal issues. No religious affiliation. Denies any past Research officer, trade union. 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   Allergies:  No Known Allergies Lab Results: No results found for this or any previous visit (from the past 48 hour(s)).  Blood Alcohol level:  Lab Results  Component Value Date   ETH 223 (H) 11/30/2011    Metabolic Disorder Labs:  No results found for: HGBA1C, MPG No results found for: PROLACTIN No results found for: CHOL, TRIG, HDL, CHOLHDL, VLDL, LDLCALC  Current Medications: Current Facility-Administered Medications  Medication Dose Route Frequency Provider Last Rate Last Dose  . acetaminophen (TYLENOL) tablet 650 mg  650 mg Oral Q6H PRN Rankin, Shuvon B, NP      . alum & mag hydroxide-simeth (MAALOX/MYLANTA) 200-200-20 MG/5ML suspension 30 mL  30 mL Oral Q4H PRN Rankin, Shuvon B, NP      . atenolol (TENORMIN) tablet 100 mg  100 mg Oral Daily Rankin, Shuvon B, NP   100 mg at 05/10/17 0806  . hydrOXYzine (ATARAX/VISTARIL) tablet 25 mg  25 mg Oral Q6H PRN Rankin, Shuvon B, NP      . loperamide (IMODIUM) capsule 2-4 mg  2-4 mg Oral PRN Rankin, Shuvon B, NP      . LORazepam (ATIVAN) tablet 1 mg  1 mg Oral Q6H PRN Rankin, Shuvon B, NP       . LORazepam (ATIVAN) tablet 1 mg  1 mg Oral TID Rankin, Shuvon B, NP   1 mg at 05/10/17 1218   Followed by  . [START ON 05/11/2017] LORazepam (ATIVAN) tablet 1 mg  1 mg Oral BID Rankin, Shuvon B, NP       Followed by  . [START ON 05/12/2017] LORazepam (ATIVAN) tablet 1 mg  1 mg Oral Daily Rankin, Shuvon B, NP      . magnesium hydroxide (MILK OF MAGNESIA) suspension 30 mL  30 mL Oral Daily PRN Rankin, Shuvon B, NP      . multivitamin with minerals tablet 1 tablet  1 tablet Oral Daily Rankin, Shuvon B, NP   1 tablet at 05/10/17 0806  . nicotine (NICODERM CQ - dosed in mg/24 hours) patch 21 mg  21 mg Transdermal Daily Money, Gerlene Burdock, FNP   21 mg at 05/10/17 0809  . ondansetron (ZOFRAN-ODT) disintegrating tablet 4 mg  4 mg Oral Q6H PRN Rankin, Shuvon B, NP      . pantoprazole (PROTONIX) EC tablet 40 mg  40 mg Oral Daily Rankin, Shuvon B, NP   40 mg at 05/10/17 0806  . PARoxetine (PAXIL) tablet 20 mg  20 mg Oral Daily Rankin, Shuvon B, NP   20 mg at 05/10/17 0806  . thiamine (VITAMIN B-1) tablet 100 mg  100 mg Oral Daily Rankin, Shuvon B, NP   100 mg at 05/10/17 0806  . traZODone (DESYREL) tablet 50 mg  50 mg Oral QHS PRN Rankin, Shuvon B, NP   50 mg at 05/09/17 2129   PTA Medications: Prescriptions Prior to Admission  Medication Sig Dispense Refill Last Dose  . clonazePAM (KLONOPIN) 0.5 MG tablet Take 0.5 mg by mouth 3 (three) times daily.   Past Month at Unknown time  . PARoxetine (PAXIL) 20 MG tablet Take 40 mg by mouth daily.   Past Month at Unknown time    Musculoskeletal: Strength & Muscle Tone: within normal limits Gait & Station: normal Patient leans: N/A  Psychiatric Specialty Exam: Physical Exam  Constitutional: He is oriented to person, place, and time. No distress.  HENT:  Head: Normocephalic and atraumatic.  Respiratory: Effort normal.  Neurological: He is alert and oriented to person, place, and time.  Skin: He is not diaphoretic.  Psychiatric:  As above    ROS   Blood pressure 114/68, pulse 73, temperature 98.6 F (37 C), temperature source Oral, resp. rate 16, height 6' 1.5" (1.867 m), weight 75.8 kg (167 lb), SpO2 99 %.Body mass index is 21.73 kg/m.  General Appearance: In hospital clothing. Not shaky, not sweaty, not confused. Not unsteady, normal conjugate eye movements. Not internally distressed. Appropriate behavior.   Eye Contact:  Good  Speech:  Clear and Coherent and Normal Rate  Volume:  Normal  Mood:  Anxious and Depressed  Affect:  Restricted  Thought Process:  Linear  Orientation:  Full (Time, Place, and Person)  Thought Content:  Rumination  Suicidal Thoughts:  No  Homicidal Thoughts:  No  Memory:  Immediate;   Fair Recent;   Fair Remote;   Fair  Judgement:  Fair  Insight:  Partial   Psychomotor Activity:  Normal  Concentration:  Concentration: Fair and Attention Span: Fair  Recall:  [employer] of Knowledge:  Fair  Language:  Good  Akathisia:  Negative  Handed:    AIMS (if indicated):     Assets:  Communication Skills Desire for Improvement Physical Health Resilience  ADL's:  Intact  Cognition:  WNL  Sleep:  Number of Hours: 6.25    Treatment Plan Summary: Patient is depressed but not pervasively down. Current psychosocial factors and use of substances is perpetuating his depression. We have agreed to recommence Paroxetine. We agreed to use Gabapentin to target anxiety. Use of benzodiazepine on a chronic basis is dangerous especially as he has been getting very high dosage. Patient is not motivated at this time to seek inpatient rehab. We discussed use of naltrexone to address cravings. Patient consented to treatment after we explored the risks and benefits.  Psychiatric: MDD Recurrent Panic Disorder AUD  Medical:  Psychosocial:   PLAN: 1. Alcohol withdrawal protocol 2. Paxil 20 mg daily. Would titrate as tolerated and needed 3. Gabapentin 300 mg TID 4. Naltrexone 50 mg daily.  5. Encourage unit groups and  activities 6. Monitor mood, behavior and interaction with peers 7. Motivational enhancement  5. SW would gather collateral from his family and coordinate aftercare.   Observation Level/Precautions:  Detox 15 minute checks  Laboratory:    Psychotherapy:    Medications:    Consultations:    Discharge Concerns:    Estimated LOS:  Other:     Physician Treatment Plan for Primary Diagnosis: <principal problem not specified> Long Term Goal(s): Improvement in symptoms so as ready for discharge  Short Term Goals: Ability to identify changes in lifestyle to reduce recurrence of condition will improve, Ability to verbalize feelings will improve, Ability to disclose and discuss suicidal ideas, Ability to demonstrate self-control will improve, Ability to identify and develop effective coping behaviors will improve, Ability to maintain clinical measurements within normal limits will improve, Compliance with prescribed medications will improve and Ability to identify triggers associated with substance abuse/mental health issues will improve  Physician Treatment Plan for Secondary Diagnosis: Active Problems:   MDD (major depressive disorder), recurrent severe, without psychosis (HCC)  Long Term Goal(s): Improvement in symptoms so as ready for discharge  Short Term Goals: Ability to identify changes in lifestyle to reduce recurrence of condition will improve, Ability to verbalize feelings will improve, Ability to disclose and discuss suicidal ideas, Ability to demonstrate  self-control will improve, Ability to identify and develop effective coping behaviors will improve, Ability to maintain clinical measurements within normal limits will improve, Compliance with prescribed medications will improve and Ability to identify triggers associated with substance abuse/mental health issues will improve  I certify that inpatient services furnished can reasonably be expected to improve the patient's condition.     Georgiann Cocker, MD 9/13/20182:43 PM

## 2017-05-10 NOTE — B[facility] Counselor (Signed)
Adult Comprehensive Assessment  Patient ID: Isla PenceJeremy Hetland, male   DOB: [birth year], 41 y.o.   MRN: [id number]  Information Source: Information source: Patient  Current Stressors:  Educational / Learning stressors: Surveyor, quantityinancial barriers returning to college, Holiday representativejunior in college, Glass blower/designermajoring in Presenter, broadcastingociology  Employment / Job issues: Unemployed  Family Relationships: Marital Social workerseperation  Financial / Lack of resources (include bankruptcy): No insurance, unemployed, [place]  Housing / Lack of housing: Lives with mother  Physical health (include injuries & life threatening diseases): N/A Social relationships: Recently seperated from wife, little contact with freinds and family  Substance abuse: Alcohol Use  Bereavement / Loss: N/A  Living/Environment/Situation:  Living Arrangements: Parent Living conditions (as described by patient or guardian): Pt states it is "up and down" How long has patient lived in current situation?: Two months  What is atmosphere in current home: Comfortable, ParamedicLoving, Supportive  Family History:  Marital status: Separated Separated, when?: 3 months  What types of issues is patient dealing with in the relationship?: Wife is having an affair  Are you sexually active?: No What is your sexual orientation?: Heterosexual  Has your sexual activity been affected by drugs, alcohol, medication, or emotional stress?: No  Does patient have children?: Yes How many children?: 3 How is patient's relationship with their children?: Pt states relationship with oldest child is good, relationship wtih middle child is strained and there is no relationship with younest child because the child was placed for adoption  Childhood History:  By whom was/is the patient raised?: Both parents Description of patient's relationship with caregiver when they were a child: Good with mother, strained with father Patient's description of current relationship with people who raised  him/her: Good with mother, strained with father  How were you disciplined when you got in trouble as a child/adolescent?: Yelling and whippings Does patient have siblings?: No Did patient suffer any verbal/emotional/physical/sexual abuse as a child?: No Did patient suffer from severe childhood neglect?: No Has patient ever been sexually abused/assaulted/raped as an adolescent or adult?: No Was the patient ever a victim of a crime or a disaster?: No Witnessed domestic violence?: No Has patient been effected by domestic violence as an adult?: No  Education:  Highest grade of school patient has completed: 12th  Currently a Consulting civil engineerstudent?: No (Was attending college and expereinced financial barriers to completion ) Learning disability?: No  Employment/Work Situation:   Employment situation: Unemployed Patient's job has been impacted by current illness: No What is the longest time patient has a held a job?: 6 years 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 [employer]  Has patient ever been in the Eli [employer] and Companymilitary?: No Has patient ever served in combat?: No Are There Guns or Other Weapons in Your Home?: No Are These Weapons Safely Secured?: Yes  Financial Resources:   Financial resources: No income Does patient have a Lawyerrepresentative payee or guardian?: No  Alcohol/Substance Abuse:   What has been your use of drugs/alcohol within the last 12 months?: Two 40oz beers everyday  If attempted suicide, did drugs/alcohol play a role in this?: No Alcohol/Substance Abuse Treatment Hx: Past Tx, Outpatient If yes, describe treatment: [facility] outpatient treatment in [place]  Has alcohol/substance abuse ever caused legal problems?: Yes Lynnell Catalan(Dui )  Social Support System:   Patient's Community Support System: None Describe Community Support System: pt states he has no supports  Type of faith/religion: None, Spiritual  How does patient's faith help to cope with current illness?: Office DepotPrays  everyday    Leisure/Recreation:   Leisure and Hobbies: Firefighter, looks for a job   Strengths/Needs:   What things does the patient do well?: Pt states he cannot think of anything  In what areas does patient struggle / problems for patient: Cutting back on his drinking   Discharge Plan:   Does patient have access to transportation?: Yes Will patient be returning to same living situation after discharge?: Yes Currently receiving community mental health services: Yes (From Whom) Risk analyst at [facility] in [place] ) If no, would patient like referral for services when discharged?: Yes (What county?) ([facility] in [place] ) Does patient have financial barriers related to discharge medications?: Yes Patient description of barriers related to discharge medications: No Income   Summary/Recommendations:   Summary and Recommendations (to be completed by the evaluator): Viraaj is a 41 year old Caucasion male who has been diagnosed with MDD, recurrent severe, without psychosis; Alcohol use disorder.  He presents with anxiety and depressed mood.  At discharge his plan is to return to his mothers home in [place].  He would like to follow up with [facility].  While in the hospital he can benefit from crisis stabilization, medication management, therapeutic milieu, and referral for services.   Aram Beecham. 05/10/2017

## 2017-05-10 NOTE — B[facility] Counselor (Signed)
Adult Comprehensive Assessment  Patient ID: Allen PenceJeremy Brown, male   DOB: [birth year], 41 y.o.   MRN: [id number]  Information Source: Information source: Patient  Current Stressors:  Educational / Learning stressors: Surveyor, quantityinancial barriers returning to college, Holiday representativejunior in college, Glass blower/designermajoring in Presenter, broadcastingociology  Employment / Job issues: Unemployed  Family Relationships: Marital Social workerseperation  Financial / Lack of resources (include bankruptcy): No insurance, unemployed, [place]  Housing / Lack of housing: Lives with mother  Physical health (include injuries & life threatening diseases): N/A Social relationships: Recently seperated from wife, little contact with freinds and family  Substance abuse: Alcohol Use  Bereavement / Loss: N/A  Living/Environment/Situation:  Living Arrangements: Parent Living conditions (as described by patient or guardian): Pt states it is "up and down" How long has patient lived in current situation?: Two months  What is atmosphere in current home: Comfortable, ParamedicLoving, Supportive  Family History:  Marital status: Separated Separated, when?: 3 months  What types of issues is patient dealing with in the relationship?: Wife is having an affair  Are you sexually active?: No What is your sexual orientation?: Heterosexual  Has your sexual activity been affected by drugs, alcohol, medication, or emotional stress?: No  Does patient have children?: Yes How many children?: 3 How is patient's relationship with their children?: Pt states relationship with oldest child is good, relationship wtih middle child is strained and there is no relationship with younest child because the child was placed for adoption  Childhood History:  By whom was/is the patient raised?: Both parents Description of patient's relationship with caregiver when they were a child: Good with mother, strained with father Patient's description of current relationship with people who raised  him/her: Good with mother, strained with father  How were you disciplined when you got in trouble as a child/adolescent?: Yelling and whippings Does patient have siblings?: No Did patient suffer any verbal/emotional/physical/sexual abuse as a child?: No Did patient suffer from severe childhood neglect?: No Has patient ever been sexually abused/assaulted/raped as an adolescent or adult?: No Was the patient ever a victim of a crime or a disaster?: No Witnessed domestic violence?: No Has patient been effected by domestic violence as an adult?: No  Education:  Highest grade of school patient has completed: 12th  Currently a Consulting civil engineerstudent?: No (Was attending college and expereinced financial barriers to completion ) Learning disability?: No  Employment/Work Situation:   Employment situation: Unemployed Patient's job has been impacted by current illness: No What is the longest time patient has a held a job?: 6 years 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 [employer]  Has patient ever been in the Eli Lilly and Companymilitary?: No Has patient ever served in combat?: No Are There Guns or Other Weapons in Your Home?: No Are These Weapons Safely Secured?: Yes  Financial Resources:   Financial resources: No income Does patient have a Lawyerrepresentative payee or guardian?: No  Alcohol/Substance Abuse:   What has been your use of drugs/alcohol within the last 12 months?: Two 40oz beers everyday  If attempted suicide, did drugs/alcohol play a role in this?: No Alcohol/Substance Abuse Treatment Hx: Past Tx, Outpatient If yes, describe treatment: [facility] outpatient treatment in [place]  Has alcohol/substance abuse ever caused legal problems?: Yes Lynnell Catalan(Dui )  Social Support System:   Patient's Community Support System: None Describe Community Support System: pt states he has no supports  Type of faith/religion: None, Spiritual  How does patient's faith help to cope with current illness?: Office DepotPrays  everyday    Leisure/Recreation:   Leisure and Hobbies: FirefighterWatches television, looks for a job   Strengths/Needs:   What things does the patient do well?: Pt states he cannot think of anything  In what areas does patient struggle / problems for patient: Cutting back on his drinking   Discharge Plan:   Does patient have access to transportation?: Yes Will patient be returning to same living situation after discharge?: Yes Currently receiving community mental health services: Yes (From Whom) Risk analyst([facility] [place] ) If no, would patient like referral for services when discharged?: Yes (What county?) ([facility] in [place] ) Does patient have financial barriers related to discharge medications?: Yes Patient description of barriers related to discharge medications: No Income   Summary/Recommendations:   Summary and Recommendations (to be completed by the evaluator): Allen RuskJeremy is a 41 year old  male who has been diagnosed with MDD, recurrent severe, 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  He presents with anxiety and depressed mood. His primary triggers for admission include increasing depression and alcohol use. Pt states that his main support is his mother at this time.  At discharge his plan is to return to his mothers [place].  He would like to follow up with [facility].  While in the hospital he can benefit from crisis stabilization, medication management, therapeutic milieu, and referral for services.   Allen JordanLynn Brown Allen Brown, [name], Theresia MajorsLCSWA  05/10/2017

## 2017-05-10 NOTE — Progress Notes (Signed)
Psychoeducational Group Note  Date:  05/10/2017 Time: 2045 Group Topic/Focus:  wrap up group  Participation Level: Did Not Attend  Participation Quality:  Not Applicable  Affect:  Not Applicable  Cognitive:  Not Applicable  Insight:  Not Applicable  Engagement in Group: Not Applicable  Additional Comments: Pt was sleeping during group time.   Marcille BuffyMcNeil, Dquan Cortopassi S 05/10/2017, 9:58 PM

## 2017-05-10 NOTE — [facility] Suicide Risk Assessment (Signed)
[facility] INPATIENT:  Family/Significant Other Suicide Prevention Education  Suicide Prevention Education:  Education Completed; Allen Brown [phone] has been identified by the patient as the family member/significant other with whom the patient will be residing, and identified as the person(s) who will aid the patient in the event of a mental health crisis (suicidal ideations/suicide attempt).  With written consent from the patient, the family member/significant other has been provided the following suicide prevention education, prior to the and/or following the discharge of the patient.  The suicide prevention education provided includes the following:  Suicide risk factors  Suicide prevention and interventions  National Suicide Hotline telephone number  [facility] assessment telephone number  [facility] Emergency Assistance 911  [facility] and/or Residential Mobile Crisis Unit telephone number  Request made of family/significant other to:  Remove weapons (e.g., guns, rifles, knives), all items previously/currently identified as safety concern.    Remove drugs/medications (over-the-counter, prescriptions, illicit drugs), all items previously/currently identified as a safety concern.  The family member/significant other verbalizes understanding of the suicide prevention education information provided.  The family member/significant other agrees to remove the items of safety concern listed above.  Allen Brown 05/10/2017, 12:07 PM

## 2017-05-10 NOTE — Progress Notes (Signed)
Patient denies SI, HI and AVH.  Patient has participated in groups and been compliant with medications.   Assess patient for safety, offer medications as prescribed, engage patient in 1:1 staff talks.   Patient able to contract for safety.  Continue to monitor as prescribed.  

## 2017-05-10 NOTE — [facility] Suicide Risk Assessment (Signed)
[facility] INPATIENT:  Family/Significant Other Suicide Prevention Education  Suicide Prevention Education:  Education Completed; Allen Brown [phone] has been identified by the patient as the family member/significant other with whom the patient will be residing, and identified as the person(s) who will aid the patient in the event of a mental health crisis (suicidal ideations/suicide attempt).  With written consent from the patient, the family member/significant other has been provided the following suicide prevention education, prior to the and/or following the discharge of the patient.  The suicide prevention education provided includes the following:  Suicide risk factors  Suicide prevention and interventions  National Suicide Hotline telephone number  [facility] assessment telephone number  [facility] Emergency Assistance 911  [facility] and/or Residential Mobile Crisis Unit telephone number  Request made of family/significant other to:  Remove weapons (e.g., guns, rifles, knives), all items previously/currently identified as safety concern.    Remove drugs/medications (over-the-counter, prescriptions, illicit drugs), all items previously/currently identified as a safety concern.  The family member/significant other verbalizes understanding of the suicide prevention education information provided.  The family member/significant other agrees to remove the items of safety concern listed above.  Aram BeechamAngel M Webster Clinical Social Work Intern 05/10/2017, 4:30 PM

## 2017-05-10 NOTE — [facility] Suicide Risk Assessment (Signed)
[facility] Admission Suicide Risk Assessment   Nursing information obtained from:  Patient Demographic factors:  Male, Divorced or widowed, Caucasian, Unemployed Current Mental Status:  Self-harm thoughts Loss Factors:  Decrease in vocational status, Legal issues, Financial problems / change in socioeconomic status Historical Factors:  Family history of mental illness or substance abuse Risk Reduction Factors:  NA  Total Time spent with patient: 30 minutes Principal Problem: MDD (major depressive disorder), recurrent severe, without psychosis (HCC) Diagnosis:   Patient Active Problem List   Diagnosis Date Noted  . Substance use disorder [F19.90] 05/10/2017  . MDD (major depressive disorder), recurrent severe, without psychosis (HCC) [F33.2] 05/09/2017   Subjective Data:  41 y.o Caucasian male recently separated, recently lost his job, recently evicted by his mom.  Transferred from [facility] ER. Presented there via EMS. Called because he was feeling hopeless and worthless. He expressed severe depression and worsening suicidal thoughts. He reports daily use of alcohol.  Multiple psychosocial stressors. Hopelessness and worthlessness. detoxing from alcohol and benzodiazepines. No support in the community. No psychosis. No mania. Not pervasively depressed. No past suicidal behavior. No family history of suicide. No overwhelming anxiety. Patient is in a safe environment and agreeable to treatment plan.   Continued Clinical Symptoms:  Alcohol Use Disorder Identification Test Final Score (AUDIT): 30 The "Alcohol Use Disorders Identification Test", Guidelines for Use in Primary Care, Second Edition.  World Science writer [facility]). Score between 0-7:  no or low risk or alcohol related problems. Score between 8-15:  moderate risk of alcohol related problems. Score between 16-19:  high risk of alcohol related problems. Score 20 or above:  warrants further diagnostic evaluation for alcohol dependence and  treatment.   CLINICAL FACTORS:   Depression:   Hopelessness Alcohol/Substance Abuse/Dependencies   Musculoskeletal: Strength & Muscle Tone: within normal limits Gait & Station: normal Patient leans: N/A  Psychiatric Specialty Exam: Physical Exam  ROS  Blood pressure 114/68, pulse 73, temperature 98.6 F (37 C), temperature source Oral, resp. rate 16, height 6' 1.5" (1.867 m), weight 75.8 kg (167 lb), SpO2 99 %.Body mass index is 21.73 kg/m.  General Appearance: As in H&P  Eye Contact:  As in H&P  Speech:  As in H&P  Volume:  As in H&P  Mood:  As in H&P  Affect:  As in H&P  Thought Process:  As in H&P  Orientation:  As in H&P  Thought Content:  As in H&P  Suicidal Thoughts:  As in H&P  Homicidal Thoughts:  As in H&P  Memory:  As in H&P  Judgement:  As in H&P  Insight:  As in H&P  Psychomotor Activity:  As in H&P  Concentration:  As in H&P  Recall:  As in H&P  Fund of Knowledge:  As in H&P  Language:  As in H&P  Akathisia:  As in H&P  Handed:  As in H&P  AIMS (if indicated):     Assets:  As in H&P  ADL's:  As in H&P  Cognition:  As in H&P  Sleep:  Number of Hours: 6.25      COGNITIVE FEATURES THAT CONTRIBUTE TO RISK:  None    SUICIDE RISK:   Minimal: No identifiable suicidal ideation.  Patients presenting with no risk factors but with morbid ruminations; may be classified as minimal risk based on the severity of the depressive symptoms  PLAN OF CARE:  As in H&P  I certify that inpatient services furnished can reasonably be expected to improve the  patient's condition.   Georgiann CockerVincent A Dorian Renfro, MD 05/10/2017, 3:14 PM

## 2017-05-10 NOTE — Tx Team (Signed)
Interdisciplinary Treatment and Diagnostic Plan Update  05/10/2017 Time of Session: 11:56 AM  [name] MRN: [id number]  Principal Diagnosis: <principal problem not specified>  Secondary Diagnoses: Active Problems:   MDD (major depressive disorder), recurrent severe, without psychosis (HCC)   Current Medications:  Current Facility-Administered Medications  Medication Dose Route Frequency Provider Last Rate Last Dose  . acetaminophen (TYLENOL) tablet 650 mg  650 mg Oral Q6H PRN Rankin, Shuvon B, NP      . alum & mag hydroxide-simeth (MAALOX/MYLANTA) 200-200-20 MG/5ML suspension 30 mL  30 mL Oral Q4H PRN Rankin, Shuvon B, NP      . atenolol (TENORMIN) tablet 100 mg  100 mg Oral Daily Rankin, Shuvon B, NP   100 mg at 05/10/17 0806  . feeding supplement (ENSURE ENLIVE) (ENSURE ENLIVE) liquid 237 mL  237 mL Oral BID BM Money, Feliz Beam B, FNP   237 mL at 05/09/17 1700  . hydrOXYzine (ATARAX/VISTARIL) tablet 25 mg  25 mg Oral Q6H PRN Rankin, Shuvon B, NP      . Influenza vac split quadrivalent PF (FLUARIX) injection 0.5 mL  0.5 mL Intramuscular Tomorrow-1000 Izediuno, Vincent A, MD      . loperamide (IMODIUM) capsule 2-4 mg  2-4 mg Oral PRN Rankin, Shuvon B, NP      . LORazepam (ATIVAN) tablet 1 mg  1 mg Oral Q6H PRN Rankin, Shuvon B, NP      . LORazepam (ATIVAN) tablet 1 mg  1 mg Oral TID Rankin, Shuvon B, NP   1 mg at 05/10/17 0805   Followed by  . [START ON 05/11/2017] LORazepam (ATIVAN) tablet 1 mg  1 mg Oral BID Rankin, Shuvon B, NP       Followed by  . [START ON 05/12/2017] LORazepam (ATIVAN) tablet 1 mg  1 mg Oral Daily Rankin, Shuvon B, NP      . magnesium hydroxide (MILK OF MAGNESIA) suspension 30 mL  30 mL Oral Daily PRN Rankin, Shuvon B, NP      . multivitamin with minerals tablet 1 tablet  1 tablet Oral Daily Rankin, Shuvon B, NP   1 tablet at 05/10/17 0806  . nicotine (NICODERM CQ - dosed in mg/24 hours) patch 21 mg  21 mg Transdermal Daily Money, Gerlene Burdock, FNP   21 mg at 05/10/17  0809  . ondansetron (ZOFRAN-ODT) disintegrating tablet 4 mg  4 mg Oral Q6H PRN Rankin, Shuvon B, NP      . pantoprazole (PROTONIX) EC tablet 40 mg  40 mg Oral Daily Rankin, Shuvon B, NP   40 mg at 05/10/17 0806  . PARoxetine (PAXIL) tablet 20 mg  20 mg Oral Daily Rankin, Shuvon B, NP   20 mg at 05/10/17 0806  . pneumococcal 23 valent vaccine (PNU-IMMUNE) injection 0.5 mL  0.5 mL Intramuscular Tomorrow-1000 Izediuno, Vincent A, MD      . thiamine (VITAMIN B-1) tablet 100 mg  100 mg Oral Daily Rankin, Shuvon B, NP   100 mg at 05/10/17 0806  . traZODone (DESYREL) tablet 50 mg  50 mg Oral QHS PRN Rankin, Shuvon B, NP   50 mg at 05/09/17 2129    PTA Medications: Prescriptions Prior to Admission  Medication Sig Dispense Refill Last Dose  . clonazePAM (KLONOPIN) 0.5 MG tablet Take 0.5 mg by mouth 3 (three) times daily.   Past Month at Unknown time  . PARoxetine (PAXIL) 20 MG tablet Take 40 mg by mouth daily.   Past Month at Unknown time  Treatment Modalities: Medication Management, Group therapy, Case management,  1 to 1 session with clinician, Psychoeducation, Recreational therapy.  Patient Stressors: Financial difficulties Marital or family conflict Medication change or noncompliance Occupational concerns Substance abuse  Patient Strengths: Wellsite geologist fund of knowledge Physical Health   Physician Treatment Plan for Primary Diagnosis: <principal problem not specified> Long Term Goal(s): Improvement in symptoms so as ready for discharge  Short Term Goals:    Medication Management: Evaluate patient's response, side effects, and tolerance of medication regimen.  Therapeutic Interventions: 1 to 1 sessions, Unit Group sessions and Medication administration.  Evaluation of Outcomes: Progressing  Physician Treatment Plan for Secondary Diagnosis: Active Problems:   MDD (major depressive disorder), recurrent severe, without psychosis (HCC)  Long Term Goal(s):  Improvement in symptoms so as ready for discharge  Short Term Goals:    Medication Management: Evaluate patient's response, side effects, and tolerance of medication regimen.  Therapeutic Interventions: 1 to 1 sessions, Unit Group sessions and Medication administration.  Evaluation of Outcomes: Progressing   RN Treatment Plan for Primary Diagnosis: <principal problem not specified> Long Term Goal(s): Knowledge of disease and therapeutic regimen to maintain health will improve  Short Term Goals: Ability to verbalize feelings will improve, Ability to identify and develop effective coping behaviors will improve and Compliance with prescribed medications will improve  Medication Management: RN will administer medications as ordered by provider, will assess and evaluate patient's response and provide education to patient for prescribed medication. RN will report any adverse and/or side effects to prescribing provider.  Therapeutic Interventions: 1 on 1 counseling sessions, Psychoeducation, Medication administration, Evaluate responses to treatment, Monitor vital signs and CBGs as ordered, Perform/monitor CIWA, COWS, AIMS and Fall Risk screenings as ordered, Perform wound care treatments as ordered.  Evaluation of Outcomes: Progressing   LCSW Treatment Plan for Primary Diagnosis: <principal problem not specified> Long Term Goal(s): Safe transition to appropriate next level of care at discharge, Engage patient in therapeutic group addressing interpersonal concerns.  Short Term Goals: Engage patient in aftercare planning with referrals and resources, Increase emotional regulation, Facilitate patient progression through stages of change regarding substance use diagnoses and concerns, Identify triggers associated with mental health/substance abuse issues and Increase skills for wellness and recovery  Therapeutic Interventions: Assess for all discharge needs, 1 to 1 time with Social worker, Explore  available resources and support systems, Assess for adequacy in community support network, Educate family and significant other(s) on suicide prevention, Complete Psychosocial Assessment, Interpersonal group therapy.  Evaluation of Outcomes: Progressing   Progress in Treatment: Attending groups: Yes Participating in groups: Yes Taking medication as prescribed: Yes Toleration of medication: Yes, no side effects reported at this time Family/Significant other contact made: Yes Conchita Paris [phone]) Patient understands diagnosis: Yes AEB asking for help with anxiety  Discussing patient identified problems/goals with staff: Yes Medical problems stabilized or resolved: Yes Denies suicidal/homicidal ideation: Yes Issues/concerns per patient self-inventory: None Other: N/A  New problem(s) identified: None identified at this time.   New Short Term/Long Term Goal(s): Pt would like help with "Anxiety and being able to afford his medications"   Discharge Plan or Barriers: Pt will return home with his mother in [place]  Reason for Continuation of Hospitalization: Anxiety Depression Medication stabilization   Estimated Length of Stay:  05/15/17  Attendees: Patient: Allen Brown 05/10/2017  11:56 AM  Physician: Jomarie Longs, MD 05/10/2017  11:56 AM  Nursing: Estella Husk, RN 05/10/2017  11:56 AM  RN Care Manager: Onnie Boer,  RN 05/10/2017  11:56 AM  Social Worker: Richelle Itood North, LCSW; Melba CoonAngel Tyjae Issa, Social Work Intern 05/10/2017  11:56 AM  Recreational Therapist: Caroll RancherMarjette Lindsay, LRT 05/10/2017  11:56 AM  Other: Tomasita Morrowelora Sutton, P4CC 05/10/2017  11:56 AM  Other:  05/10/2017  11:56 AM  Other: 05/10/2017  11:56 AM    Scribe for Treatment Team: Aram BeechamAngel M Jamyria Ozanich, Student-Social Work 05/10/2017 11:56 AM

## 2017-05-11 MED ORDER — PAROXETINE HCL 20 MG PO TABS
40.0000 mg | ORAL_TABLET | Freq: Every day | ORAL | Status: DC
  Administered 2017-05-12 – 2017-05-14 (×3): 40 mg via ORAL
  Filled 2017-05-11: qty 2
  Filled 2017-05-11: qty 6
  Filled 2017-05-11 (×3): qty 2

## 2017-05-11 MED ORDER — TRAZODONE HCL 50 MG PO TABS
50.0000 mg | ORAL_TABLET | Freq: Every evening | ORAL | Status: DC | PRN
  Administered 2017-05-12: 50 mg via ORAL

## 2017-05-11 NOTE — Tx Team (Signed)
Interdisciplinary Treatment and Diagnostic Plan Update  05/11/2017 Time of Session: 1:26 PM  Allen Brown MRN: [id number]  Principal Diagnosis: MDD (major depressive disorder), recurrent severe, without psychosis (HCC)  Secondary Diagnoses: Principal Problem:   MDD (major depressive disorder), recurrent severe, without psychosis (HCC) Active Problems:   Substance use disorder   Current Medications:  Current Facility-Administered Medications  Medication Dose Route Frequency Provider Last Rate Last Dose  . acetaminophen (TYLENOL) tablet 650 mg  650 mg Oral Q6H PRN Rankin, Shuvon B, NP   650 mg at 05/11/17 0821  . alum & mag hydroxide-simeth (MAALOX/MYLANTA) 200-200-20 MG/5ML suspension 30 mL  30 mL Oral Q4H PRN Rankin, Shuvon B, NP      . atenolol (TENORMIN) tablet 100 mg  100 mg Oral Daily Rankin, Shuvon B, NP   100 mg at 05/11/17 0818  . gabapentin (NEURONTIN) capsule 300 mg  300 mg Oral TID Georgiann Cocker, MD   300 mg at 05/11/17 1317  . hydrOXYzine (ATARAX/VISTARIL) tablet 25 mg  25 mg Oral Q6H PRN Rankin, Shuvon B, NP      . loperamide (IMODIUM) capsule 2-4 mg  2-4 mg Oral PRN Rankin, Shuvon B, NP      . LORazepam (ATIVAN) tablet 1 mg  1 mg Oral Q6H PRN Rankin, Shuvon B, NP   1 mg at 05/11/17 0817  . LORazepam (ATIVAN) tablet 1 mg  1 mg Oral BID Rankin, Shuvon B, NP   1 mg at 05/11/17 0818   Followed by  . [START ON 05/12/2017] LORazepam (ATIVAN) tablet 1 mg  1 mg Oral Daily Rankin, Shuvon B, NP      . magnesium hydroxide (MILK OF MAGNESIA) suspension 30 mL  30 mL Oral Daily PRN Rankin, Shuvon B, NP      . multivitamin with minerals tablet 1 tablet  1 tablet Oral Daily Rankin, Shuvon B, NP   1 tablet at 05/11/17 0818  . nicotine (NICODERM CQ - dosed in mg/24 hours) patch 21 mg  21 mg Transdermal Daily Money, Gerlene Burdock, FNP   21 mg at 05/11/17 0818  . ondansetron (ZOFRAN-ODT) disintegrating tablet 4 mg  4 mg Oral Q6H PRN Rankin, Shuvon B, NP      . pantoprazole (PROTONIX) EC  tablet 40 mg  40 mg Oral Daily Rankin, Shuvon B, NP   40 mg at 05/11/17 0818  . PARoxetine (PAXIL) tablet 20 mg  20 mg Oral Daily Rankin, Shuvon B, NP   20 mg at 05/11/17 0817  . thiamine (VITAMIN B-1) tablet 100 mg  100 mg Oral Daily Rankin, Shuvon B, NP   100 mg at 05/11/17 0817  . traZODone (DESYREL) tablet 50 mg  50 mg Oral QHS PRN Rankin, Shuvon B, NP   50 mg at 05/09/17 2129    PTA Medications: Prescriptions Prior to Admission  Medication Sig Dispense Refill Last Dose  . clonazePAM (KLONOPIN) 0.5 MG tablet Take 0.5 mg by mouth 3 (three) times daily.   Past Month at Unknown time  . PARoxetine (PAXIL) 20 MG tablet Take 40 mg by mouth daily.   Past Month at Unknown time    Treatment Modalities: Medication Management, Group therapy, Case management,  1 to 1 session with clinician, Psychoeducation, Recreational therapy.  Patient Stressors: Financial difficulties Marital or family conflict Medication change or noncompliance Occupational concerns Substance abuse  Patient Strengths: Wellsite geologist fund of knowledge Physical Health   Physician Treatment Plan for Primary Diagnosis: MDD (major depressive disorder), recurrent severe, without  psychosis (HCC) Long Term Goal(s): Improvement in symptoms so as ready for discharge  Short Term Goals: Ability to identify changes in lifestyle to reduce recurrence of condition will improve Ability to verbalize feelings will improve Ability to disclose and discuss suicidal ideas Ability to demonstrate self-control will improve Ability to identify and develop effective coping behaviors will improve Ability to maintain clinical measurements within normal limits will improve Compliance with prescribed medications will improve Ability to identify triggers associated with substance abuse/mental health issues will improve Ability to identify changes in lifestyle to reduce recurrence of condition will improve Ability to verbalize  feelings will improve Ability to disclose and discuss suicidal ideas Ability to demonstrate self-control will improve Ability to identify and develop effective coping behaviors will improve Ability to maintain clinical measurements within normal limits will improve Compliance with prescribed medications will improve Ability to identify triggers associated with substance abuse/mental health issues will improve  Medication Management: Evaluate patient's response, side effects, and tolerance of medication regimen.  Therapeutic Interventions: 1 to 1 sessions, Unit Group sessions and Medication administration.  Evaluation of Outcomes: Progressing  Physician Treatment Plan for Secondary Diagnosis: Principal Problem:   MDD (major depressive disorder), recurrent severe, without psychosis (HCC) Active Problems:   Substance use disorder  Long Term Goal(s): Improvement in symptoms so as ready for discharge  Short Term Goals: Ability to identify changes in lifestyle to reduce recurrence of condition will improve Ability to verbalize feelings will improve Ability to disclose and discuss suicidal ideas Ability to demonstrate self-control will improve Ability to identify and develop effective coping behaviors will improve Ability to maintain clinical measurements within normal limits will improve Compliance with prescribed medications will improve Ability to identify triggers associated with substance abuse/mental health issues will improve Ability to identify changes in lifestyle to reduce recurrence of condition will improve Ability to verbalize feelings will improve Ability to disclose and discuss suicidal ideas Ability to demonstrate self-control will improve Ability to identify and develop effective coping behaviors will improve Ability to maintain clinical measurements within normal limits will improve Compliance with prescribed medications will improve Ability to identify triggers  associated with substance abuse/mental health issues will improve  Medication Management: Evaluate patient's response, side effects, and tolerance of medication regimen.  Therapeutic Interventions: 1 to 1 sessions, Unit Group sessions and Medication administration.  Evaluation of Outcomes: Progressing   RN Treatment Plan for Primary Diagnosis: MDD (major depressive disorder), recurrent severe, without psychosis (HCC) Long Term Goal(s): Knowledge of disease and therapeutic regimen to maintain health will improve  Short Term Goals: Ability to verbalize feelings will improve, Ability to identify and develop effective coping behaviors will improve and Compliance with prescribed medications will improve  Medication Management: RN will administer medications as ordered by provider, will assess and evaluate patient's response and provide education to patient for prescribed medication. RN will report any adverse and/or side effects to prescribing provider.  Therapeutic Interventions: 1 on 1 counseling sessions, Psychoeducation, Medication administration, Evaluate responses to treatment, Monitor vital signs and CBGs as ordered, Perform/monitor CIWA, COWS, AIMS and Fall Risk screenings as ordered, Perform wound care treatments as ordered.  Evaluation of Outcomes: Progressing   LCSW Treatment Plan for Primary Diagnosis: MDD (major depressive disorder), recurrent severe, without psychosis (HCC) Long Term Goal(s): Safe transition to appropriate next level of care at discharge, Engage patient in therapeutic group addressing interpersonal concerns.  Short Term Goals: Engage patient in aftercare planning with referrals and resources, Increase emotional regulation, Facilitate patient progression through stages  of change regarding substance use diagnoses and concerns, Identify triggers associated with mental health/substance abuse issues and Increase skills for wellness and recovery  Therapeutic  Interventions: Assess for all discharge needs, 1 to 1 time with Social worker, Explore available resources and support systems, Assess for adequacy in community support network, Educate family and significant other(s) on suicide prevention, Complete Psychosocial Assessment, Interpersonal group therapy.  Evaluation of Outcomes: Progressing   Progress in Treatment: Attending groups: Yes Participating in groups: Yes Taking medication as prescribed: Yes Toleration of medication: Yes, no side effects reported at this time Family/Significant other contact made: Yes Conchita Paris [phone]) Patient understands diagnosis: Yes AEB asking for help with anxiety  Discussing patient identified problems/goals with staff: Yes Medical problems stabilized or resolved: Yes Denies suicidal/homicidal ideation: Yes Issues/concerns per patient self-inventory: None Other: N/A  New problem(s) identified: None identified at this time.   New Short Term/Long Term Goal(s): Pt would like help with "Anxiety and being able to afford his medications"   Discharge Plan or Barriers: Pt will return home and follow up with an outpatient provider.  Reason for Continuation of Hospitalization: Anxiety Depression Medication stabilization   Estimated Length of Stay:  1-4 days, Estimated discharge date 05/15/17  Attendees: Patient: Allen Brown 05/11/2017  1:26 PM  Physician: Jomarie Longs, MD 05/11/2017  1:26 PM  Nursing: Estella Husk, RN 05/11/2017  1:26 PM  RN Care Manager: Onnie Boer, RN 05/11/2017  1:26 PM  Social Worker: Richelle Ito, LCSW; Melba Coon, Social Work Intern 05/11/2017  1:26 PM  Recreational Therapist: Caroll Rancher, LRT 05/11/2017  1:26 PM  Other: Tomasita Morrow, P4CC 05/11/2017  1:26 PM  Other:  05/11/2017  1:26 PM  Other: 05/11/2017  1:26 PM    Scribe for Treatment Team: Jonathon Jordan, LCSWA 05/11/2017 1:26 PM

## 2017-05-11 NOTE — Progress Notes (Signed)
D: Patient observed isolative to room, bed. Frequent contacts made 1:1 throughout shift. Patient verbalizes to this Clinical research associate he is "doing okay." Forwards minimal information. Patient's affect blunted, flat with depressed mood. Unable to state goal for today. Complaining of collar bone pain of a 7/10, no other physical problems.   A: Medicated per orders, prn tylenol given for discomfort. Level III obs in place for safety. Emotional support offered and self inventory reviewed. Encouraged completion of Suicide Safety Plan and programming participation. Discussed POC with MD, SW.   R: Patient verbalizes understanding of POC. On reassess, patient is asleep. Patient denies SI/HI/AVH and remains safe on level III obs. Will continue to monitor closely and make verbal contact frequently.

## 2017-05-11 NOTE — Progress Notes (Signed)
Recreation Therapy Notes  Date: 05/11/17 Time: 0930 Location: 300 [address] Group Room  Group Topic: Stress Management  Goal Area(s) Addresses:  Patient will verbalize importance of using healthy stress management.  Patient will identify positive emotions associated with healthy stress management.   Intervention: Stress Management  Activity :  Meditation.  LRT introduced the stress management technique of meditation.  LRT played a meditation from the Calm app to allow patients to engage in meditation to focus on trying to center their thoughts.  Education:  Stress Management, Discharge Planning.   Education Outcome: Acknowledges edcuation/In group clarification offered/Needs additional education  Clinical Observations/Feedback: Pt did not attend group.   Jaquarious Grey, LRT/CTRS         Rogan Wigley A 05/11/2017 12:48 PM 

## 2017-05-11 NOTE — Progress Notes (Signed)
D: Pt is flat and withdrawn to room-was in bed all evening with eyes closed; "I need to catchup on sleep." Pt does not look to be in any acute distress. Support, encouragement, and safe environment provided. 15-minute safety checks continue.

## 2017-05-11 NOTE — Progress Notes (Signed)
D.  Pt pleasant on approach, denies complaints at this time.  Pt states he had a hard time sleeping last night, had not known he had medication available.  Pt denies SI/HI/AVH at this time.  Pt observed in dayroom watching peers play cards.  A.  Support and encouragement offered, medication given as ordered for sleep and anxiety.  R.   Pt remain safe on the unit, will continue to monitor.

## 2017-05-11 NOTE — [facility] Group Notes (Signed)
LCSW Group Therapy Note  05/11/2017 1:15pm  Type of Therapy and Topic:  Group Therapy:  Feelings around Relapse and Recovery  Participation Level: Pt invited. Did not attend.   Jonathon Jordan, [name], LCSWA 05/11/2017 4:23 PM

## 2017-05-11 NOTE — Progress Notes (Signed)
[facility] MD Progress Note  05/11/2017 5:35 PM Allen Brown  MRN:  [id number] Subjective:   41 y.o Caucasian male recently separated, recently lost his job, recently evicted by his mom.  Transferred from [facility]. Presented there via EMS. Called because he was feeling hopeless and worthless. He expressed severe depression and worsening suicidal thoughts. He reports daily use of alcohol.   Chart reviewed today. Patient discussed at team today.  Staff reports that he has been isolating self in his room. Limited group participation. He has not voiced any suicidal thoughts since he has been here. He has not been observed to be internally stimulated. No behavioral issues. Vitals and CIWA are trending down.   Seen today. Says he is feeling better. No sweatiness, no headaches. No retching, nausea or vomiting. No fullness in the head. No visual, tactile or auditory hallucination. No internal restlessness. No suicidal or homicidal thoughts. No thoughts of violence. Tolerating is medications well.  Patient says he does not know when groups are on. Says he has been relying on his roommate for cues. Encouraged to participate in unit groups. Not processing aftercare yet.     Principal Problem: MDD (major depressive disorder), recurrent severe, without psychosis (HCC) Diagnosis:   Patient Active Problem List   Diagnosis Date Noted  . Substance use disorder [F19.90] 05/10/2017  . MDD (major depressive disorder), recurrent severe, without psychosis (HCC) [F33.2] 05/09/2017   Total Time spent with patient: 20 minutes  Past Psychiatric History: As in H&P  Past Medical History:  Past Medical History:  Diagnosis Date  . GERD (gastroesophageal reflux disease)   . Hypertension   . PTSD (post-traumatic stress disorder)     Past Surgical History:  Procedure Laterality Date  . ELBOW SURGERY     Family History: History reviewed. No pertinent family history. Family Psychiatric  History: As in H&P Social  History:  History  Alcohol Use  . Yes     History  Drug Use No    Social History   Social History  . Marital status: Legally Separated    Spouse name: N/A  . Number of children: N/A  . Years of education: N/A   Social History Main Topics  . Smoking status: Current Every Day Smoker    Packs/day: 1.00  . Smokeless tobacco: Never Used  . Alcohol use Yes  . Drug use: No  . Sexual activity: Not Asked   Other Topics Concern  . None   Social History Narrative  . None   Additional Social History:    Pain Medications: see MAR Prescriptions: see MAR Over the Counter: see MAR History of alcohol / drug use?: Yes Name of Substance 1: alcohol 1 - Age of First Use: UTA 1 - Amount (size/oz): 2 40 oz beers 1 - Frequency: daily 1 - Duration: unknown  1 - Last Use / Amount: 05/08/17, unknown amount, positive ETOH       Sleep: Good  Appetite:  Good  Current Medications: Current Facility-Administered Medications  Medication Dose Route Frequency Provider Last Rate Last Dose  . acetaminophen (TYLENOL) tablet 650 mg  650 mg Oral Q6H PRN Rankin, Shuvon B, NP   650 mg at 05/11/17 0821  . alum & mag hydroxide-simeth (MAALOX/MYLANTA) 200-200-20 MG/5ML suspension 30 mL  30 mL Oral Q4H PRN Rankin, Shuvon B, NP      . atenolol (TENORMIN) tablet 100 mg  100 mg Oral Daily Rankin, Shuvon B, NP   100 mg at 05/11/17 0818  . gabapentin (  NEURONTIN) capsule 300 mg  300 mg Oral TID Georgiann Cocker, MD   300 mg at 05/11/17 1720  . hydrOXYzine (ATARAX/VISTARIL) tablet 25 mg  25 mg Oral Q6H PRN Rankin, Shuvon B, NP      . loperamide (IMODIUM) capsule 2-4 mg  2-4 mg Oral PRN Rankin, Shuvon B, NP      . LORazepam (ATIVAN) tablet 1 mg  1 mg Oral Q6H PRN Rankin, Shuvon B, NP   1 mg at 05/11/17 0817  . [START ON 05/12/2017] LORazepam (ATIVAN) tablet 1 mg  1 mg Oral Daily Rankin, Shuvon B, NP      . magnesium hydroxide (MILK OF MAGNESIA) suspension 30 mL  30 mL Oral Daily PRN Rankin, Shuvon B, NP       . multivitamin with minerals tablet 1 tablet  1 tablet Oral Daily Rankin, Shuvon B, NP   1 tablet at 05/11/17 0818  . nicotine (NICODERM CQ - dosed in mg/24 hours) patch 21 mg  21 mg Transdermal Daily Money, Gerlene Burdock, FNP   21 mg at 05/11/17 0818  . ondansetron (ZOFRAN-ODT) disintegrating tablet 4 mg  4 mg Oral Q6H PRN Rankin, Shuvon B, NP      . pantoprazole (PROTONIX) EC tablet 40 mg  40 mg Oral Daily Rankin, Shuvon B, NP   40 mg at 05/11/17 0818  . PARoxetine (PAXIL) tablet 20 mg  20 mg Oral Daily Rankin, Shuvon B, NP   20 mg at 05/11/17 0817  . thiamine (VITAMIN B-1) tablet 100 mg  100 mg Oral Daily Rankin, Shuvon B, NP   100 mg at 05/11/17 0817  . traZODone (DESYREL) tablet 50 mg  50 mg Oral QHS PRN Rankin, Shuvon B, NP   50 mg at 05/09/17 2129    Lab Results: No results found for this or any previous visit (from the past 48 hour(s)).  Blood Alcohol level:  Lab Results  Component Value Date   ETH 223 (H) 11/30/2011    Metabolic Disorder Labs: No results found for: HGBA1C, MPG No results found for: PROLACTIN No results found for: CHOL, TRIG, HDL, CHOLHDL, VLDL, LDLCALC  Physical Findings: AIMS: Facial and Oral Movements Muscles of Facial Expression: None, normal Lips and Perioral Area: None, normal Jaw: None, normal Tongue: None, normal,Extremity Movements Upper (arms, wrists, hands, fingers): None, normal Lower (legs, knees, ankles, toes): None, normal, Trunk Movements Neck, shoulders, hips: None, normal, Overall Severity Severity of abnormal movements (highest score from questions above): None, normal Incapacitation due to abnormal movements: None, normal Patient's awareness of abnormal movements (rate only patient's report): No Awareness, Dental Status Current problems with teeth and/or dentures?: No Does patient usually wear dentures?: No  CIWA:  CIWA-Ar Total: 4 COWS:     Musculoskeletal: Strength & Muscle Tone: within normal limits Gait & Station: normal Patient  leans: N/A  Psychiatric Specialty Exam: Physical Exam  Constitutional: He is oriented to person, place, and time. No distress.  Neck: Neck supple.  Respiratory: Effort normal.  Neurological: He is alert and oriented to person, place, and time.  Skin: He is not diaphoretic.  Psychiatric:  As above.     ROS  Blood pressure 113/85, pulse 89, temperature 98.9 F (37.2 C), temperature source Oral, resp. rate 16, height 6' 1.5" (1.867 m), weight 75.8 kg (167 lb), SpO2 99 %.Body mass index is 21.73 kg/m.  General Appearance: In bed, not in any distress. No tremors. Not sweaty. Not confused. Appropriate behavior.   Eye Contact:  Good  Speech:  Clear and Coherent and Normal Rate  Volume:  Normal  Mood:  Feels better today.   Affect:  Appropriate and Restricted  Thought Process:  Linear  Orientation:  Full (Time, Place, and Person)  Thought Content:  Less ruminations. No thoughts of violence. No hallucination in any modality.   Suicidal Thoughts:  No  Homicidal Thoughts:  No  Memory:  Immediate;   Good Recent;   Fair Remote;   Good  Judgement:  Fair  Insight:  Fair  Psychomotor Activity:  Normal  Concentration:  Concentration: Good and Attention Span: Good  Recall:  Good  Fund of Knowledge:  Good  Language:  Good  Akathisia:  Negative  Handed:    AIMS (if indicated):     Assets:  Communication Skills Desire for Improvement Physical Health Resilience  ADL's:  Intact  Cognition:  WNL  Sleep:  Number of Hours: 5.75     Treatment Plan Summary:  Patient is coming off psychoactive substances. No complications so far. No suicidal thoughts since he has been here. We are still evaluating him.  Psychiatric: MDD Recurrent Panic Disorder AUD  Medical:  Psychosocial:   PLAN: 1. Increase Paxil to 40 mg daily from tomorrow.   2. Continue other medications at current dose 3. Encourage unit groups and activities.   Georgiann Cocker, MD 05/11/2017, 5:35 PM

## 2017-05-12 MED ORDER — HYDROXYZINE HCL 25 MG PO TABS
25.0000 mg | ORAL_TABLET | Freq: Three times a day (TID) | ORAL | Status: DC | PRN
  Administered 2017-05-12 – 2017-05-14 (×5): 25 mg via ORAL
  Filled 2017-05-12 (×4): qty 1
  Filled 2017-05-12: qty 10
  Filled 2017-05-12: qty 1

## 2017-05-12 MED ORDER — TRAZODONE HCL 100 MG PO TABS
100.0000 mg | ORAL_TABLET | Freq: Every day | ORAL | Status: DC
  Administered 2017-05-12 – 2017-05-13 (×2): 100 mg via ORAL
  Filled 2017-05-12 (×6): qty 1

## 2017-05-12 NOTE — Plan of Care (Signed)
Problem: Activity: Goal: Interest or engagement in activities will improve Outcome: Progressing Patient was reclusive to his room earlier in shift, but was more visible towards the evening.  Pt was cooperative with staff.  Problem: Coping: Goal: Ability to cope will improve Outcome: Not Progressing Patient verbalized anxiety all day, but anxiety was worse in the morning, and pt was more engaged in activities and more interactive with peers and staff towards late afternoon and evening.  Problem: Safety: Goal: Ability to disclose and discuss suicidal ideas will improve Outcome: Progressing Patient denies SI/HI/AVH through out entire shift, appears blunted and depressed, was medicated with Ativan  in the morning and Atarax  for anxiety, and medications were effective as he appeared more interactive in the latter part of the shift.  Comments: Patient ate all of his meals for the day, reports his appetite as good, listed his goal for the day as being able to control his anxiety, and to take his medications in order to meet this goal.  Pt denies having any other concerns, Q15 minute checks in place, will continue to monitor.
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
## 2017-05-12 NOTE — [facility] Group Notes (Signed)
[facility] LCSW Group Therapy Note  05/12/2017 at  9:10 to 10:00 AM  Type of Therapy and Topic:  Group Therapy: Avoiding Self-Sabotaging and Enabling Behaviors  Participation Level:  Did Not Attend; invited to participate yet did not despite overhead announcement and encouragement by staff     Abdurrahman Petersheim C Kadasia Kassing, LCSW 

## 2017-05-12 NOTE — Progress Notes (Signed)
Writer has observed patient up in the dayroom palying cards with peers and at times watching others play. He c/o feeling anxious and received prn of visteril. He was informed of his medications and is hopeful to sleep better tonight. Support given and safety maintained on unit with 15 min checks.

## 2017-05-13 NOTE — Plan of Care (Signed)
Problem: Education: Goal: Verbalization of understanding the information provided will improve Outcome: Completed/Met Date Met: 05/13/17 Patient verbalizes understanding of information, education provided.  Problem: Medication: Goal: Compliance with prescribed medication regimen will improve Outcome: Completed/Met Date Met: 05/13/17 Patient is med compliant and verbalizes intent to remain so upon discharge.

## 2017-05-13 NOTE — Progress Notes (Signed)
[facility] MD Progress Note  05/13/2017 2:29 PM Allen Brown  MRN:  [id number] Subjective:   41 y.o Caucasian male recently separated, recently lost his job, recently evicted by his mom.  Transferred from [facility] ER. Presented there via EMS. Called because he was feeling hopeless and worthless. He expressed severe depression and worsening suicidal thoughts. He reports daily use of alcohol.   Chart reviewed today. Patient discussed at team today.  Staff reports that he has been appropriate on the unit. He has been interacting with peers. Enjoys to play cards with peers. Did not attend some of the groups. Has not voiced any thoughts of violence. Has not been observed to withdrawn or internally stimulated.   Seen today. Doing well. Says his spirits has lifted. No suicidal thoughts. Energy is back to normal. Eating and sleeping well. No violent thoughts. Hopes he could be discharged tomorrow. Tolerating is medications well.    Principal Problem: MDD (major depressive disorder), recurrent severe, without psychosis (HCC) Diagnosis:   Patient Active Problem List   Diagnosis Date Noted  . Substance use disorder [F19.90] 05/10/2017  . MDD (major depressive disorder), recurrent severe, without psychosis (HCC) [F33.2] 05/09/2017   Total Time spent with patient: 20 minutes  Past Psychiatric History: As in H&P  Past Medical History:  Past Medical History:  Diagnosis Date  . GERD (gastroesophageal reflux disease)   . Hypertension   . PTSD (post-traumatic stress disorder)     Past Surgical History:  Procedure Laterality Date  . ELBOW SURGERY     Family History: History reviewed. No pertinent family history. Family Psychiatric  History: As in H&P Social History:  History  Alcohol Use  . Yes     History  Drug Use No    Social History   Social History  . Marital status: Legally Separated    Spouse name: N/A  . Number of children: N/A  . Years of education: N/A   Social History Main  Topics  . Smoking status: Current Every Day Smoker    Packs/day: 1.00  . Smokeless tobacco: Never Used  . Alcohol use Yes  . Drug use: No  . Sexual activity: Not Asked   Other Topics Concern  . None   Social History Narrative  . None   Additional Social History:    Pain Medications: see MAR Prescriptions: see MAR Over the Counter: see MAR History of alcohol / drug use?: Yes Name of Substance 1: alcohol 1 - Age of First Use: UTA 1 - Amount (size/oz): 2 40 oz beers 1 - Frequency: daily 1 - Duration: unknown  1 - Last Use / Amount: 05/08/17, unknown amount, positive ETOH       Sleep: Good  Appetite:  Good  Current Medications: Current Facility-Administered Medications  Medication Dose Route Frequency Provider Last Rate Last Dose  . acetaminophen (TYLENOL) tablet 650 mg  650 mg Oral Q6H PRN Rankin, Shuvon B, NP   650 mg at 05/11/17 0821  . alum & mag hydroxide-simeth (MAALOX/MYLANTA) 200-200-20 MG/5ML suspension 30 mL  30 mL Oral Q4H PRN Rankin, Shuvon B, NP      . atenolol (TENORMIN) tablet 100 mg  100 mg Oral Daily Rankin, Shuvon B, NP   100 mg at 05/13/17 0833  . gabapentin (NEURONTIN) capsule 300 mg  300 mg Oral TID Georgiann Cocker, MD   300 mg at 05/13/17 1208  . hydrOXYzine (ATARAX/VISTARIL) tablet 25 mg  25 mg Oral TID PRN Jackelyn Poling, NP   25  mg at 05/13/17 0834  . magnesium hydroxide (MILK OF MAGNESIA) suspension 30 mL  30 mL Oral Daily PRN Rankin, Shuvon B, NP      . multivitamin with minerals tablet 1 tablet  1 tablet Oral Daily Rankin, Shuvon B, NP   1 tablet at 05/13/17 0833  . nicotine (NICODERM CQ - dosed in mg/24 hours) patch 21 mg  21 mg Transdermal Daily Money, Gerlene Burdock, FNP   21 mg at 05/13/17 0835  . pantoprazole (PROTONIX) EC tablet 40 mg  40 mg Oral Daily Rankin, Shuvon B, NP   40 mg at 05/13/17 0833  . PARoxetine (PAXIL) tablet 40 mg  40 mg Oral Daily Izediuno, Delight Ovens, MD   40 mg at 05/13/17 0833  . thiamine (VITAMIN B-1) tablet 100 mg  100 mg  Oral Daily Rankin, Shuvon B, NP   100 mg at 05/13/17 0833  . traZODone (DESYREL) tablet 100 mg  100 mg Oral QHS Izediuno, Delight Ovens, MD   100 mg at 05/12/17 2215    Lab Results: No results found for this or any previous visit (from the past 48 hour(s)).  Blood Alcohol level:  Lab Results  Component Value Date   ETH 223 (H) 11/30/2011    Metabolic Disorder Labs: No results found for: HGBA1C, MPG No results found for: PROLACTIN No results found for: CHOL, TRIG, HDL, CHOLHDL, VLDL, LDLCALC  Physical Findings: AIMS: Facial and Oral Movements Muscles of Facial Expression: None, normal Lips and Perioral Area: None, normal Jaw: None, normal Tongue: None, normal,Extremity Movements Upper (arms, wrists, hands, fingers): None, normal Lower (legs, knees, ankles, toes): None, normal, Trunk Movements Neck, shoulders, hips: None, normal, Overall Severity Severity of abnormal movements (highest score from questions above): None, normal Incapacitation due to abnormal movements: None, normal Patient's awareness of abnormal movements (rate only patient's report): No Awareness, Dental Status Current problems with teeth and/or dentures?: No Does patient usually wear dentures?: No  CIWA:  CIWA-Ar Total: 2 COWS:     Musculoskeletal: Strength & Muscle Tone: within normal limits Gait & Station: normal Patient leans: N/A  Psychiatric Specialty Exam: Physical Exam  Constitutional: He is oriented to person, place, and time. He appears well-developed and well-nourished.  Neck: Neck supple.  Respiratory: Effort normal.  Neurological: He is alert and oriented to person, place, and time.  Psychiatric:  As above.     ROS  Blood pressure 118/83, pulse 84, temperature 97.7 F (36.5 C), temperature source Oral, resp. rate 16, height 6' 1.5" (1.867 m), weight 75.8 kg (167 lb), SpO2 99 %.Body mass index is 21.73 kg/m.  General Appearance: Groomed, neatly dressed. Good eye contact. Good rapport. Normal  psychomotor activity. Appropriate behavior  Eye Contact:  Good  Speech:  Spontaneous, normal prosody. Normal tone and rate.   Volume:  Normal  Mood:  Euthymic  Affect: Full range and appropriate .   Thought Process:  Linear and logical.  Orientation:  Full (Time, Place, and Person)  Thought Content:  No delusional theme. No preoccupation with violent thoughts. No negative ruminations. No obsession.  No hallucination in any modality.   Suicidal Thoughts:  No  Homicidal Thoughts:  No  Memory:  WNL  Judgement:  Good  Insight:  Better  Psychomotor Activity:  Normal  Concentration:  Concentration: Good and Attention Span: Good  Recall:  Good  Fund of Knowledge:  Good  Language:  Good  Akathisia:  Negative  Handed:    AIMS (if indicated):     Assets:  Communication Skills Desire for Improvement Physical Health Resilience  ADL's:  Intact  Cognition:  WNL  Sleep:  Number of Hours: 6.25     Treatment Plan Summary:  Patient has completely come off substances. Mood has improved. He functioning normally. No evidence of psychosis. No evidence of mania or depression. No dangerousness. Hopeful discharge tomorrow.   Psychiatric: MDD Recurrent Panic Disorder AUD  Medical:  Psychosocial:   PLAN: 1. Continue medications at current dose   2. Continue to monitor mood, behavior and interaction with peers 3. Continue to encourage unit groups and activities.   Georgiann Cocker, MD 05/13/2017, 2:29 PMPatient ID: [name], male   DOB: 07-18-1976, 41 y.o.   MRN: [id number] Patient ID: Jaryan Chicoine, male   DOB: August 28, 1976, 41 y.o.   MRN: [id number]

## 2017-05-13 NOTE — [facility] Group Notes (Addendum)
Entered in error

## 2017-05-13 NOTE — Progress Notes (Signed)
D: Patient observed to be isolative much of the time. Minimal interaction with peers. Restless. Frequent contacts made 1:1 throughout shift. 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 Made contact with them by phone and he plans to meet with them tomorrow after his tentative discharge for tomorrow. Patient's affect animated and anxious, mood anxious but pleasant. Per self inventory and discussions with writer, rates depression and hopelessness both at a 0/10 and anxiety at a 2/10. Rates sleep as good, appetite as good, energy as high and concentration as good.  States goal for today is "controlling my anxiety" and "continue taking my meds." Complained of back pain before dinner that he rated at a 3/10, no other physical problems.   A: Medicated per orders, prn vistaril and tylenol given for complaints. Level III obs in place for safety. Emotional support offered and self inventory reviewed. Encouraged completion of Suicide Safety Plan and programming participation.   R: Patient verbalizes understanding of POC. On reassess, patient's anxiety decreased and pain rated at a 0/10. Patient denies SI/HI/AVH and remains safe on level III obs. Will continue to monitor closely and make verbal contact frequently.

## 2017-05-13 NOTE — Progress Notes (Signed)
Adult Psychoeducational Group Note  Date:  05/13/2017 Time:  12:58 AM  Group Topic/Focus:  Wrap-Up Group:   The focus of this group is to help patients review their daily goal of treatment and discuss progress on daily workbooks.  Participation Level:  Active  Participation Quality:  Appropriate  Affect:  Appropriate  Cognitive:  Appropriate  Insight: Appropriate  Engagement in Group:  Engaged  Modes of Intervention:  Discussion  Additional Comments:  Pt attend the AA group this evening without any issues.  Felipa Furnace 05/13/2017, 12:58 AM

## 2017-05-13 NOTE — [facility] Group Notes (Signed)
[facility] Group Notes:  (Nursing/MHT/Case Management/Adjunct)  Date:  05/13/2017  Time:  3:32 PM  Type of Therapy:  Guided Meditation Group  Participation Level:  Active  Participation Quality:  Attentive  Affect:  Anxious  Cognitive:  Alert and Oriented  Insight:  Appropriate  Engagement in Group:  Engaged  Modes of Intervention:  Education and Exploration  Summary of Progress/Problems: Attended "guided meditation for new beginnings"; participated appropriately.   Rayya Yagi M Rane Blitch 05/13/2017, 3:32 PM 

## 2017-05-13 NOTE — [facility] Group Notes (Signed)
[facility] LCSW Group Therapy Note  05/13/2017  10:15 to 11 AM   Type of Therapy and Topic: Group Therapy: Feelings Around Returning Home & Establishing a Supportive Framework   Participation Level: Did Not attend; invited to participate yet did not despite overhead announcement and encouragement by staff     Catherine C Harrill, LCSW     

## 2017-05-14 MED ORDER — TRAZODONE HCL 100 MG PO TABS: 100.0000 mg | ORAL_TABLET | Freq: Every evening | ORAL | 0 refills | Status: DC | PRN

## 2017-05-14 MED ORDER — HYDROXYZINE HCL 25 MG PO TABS: 25.0000 mg | ORAL_TABLET | Freq: Three times a day (TID) | ORAL | 0 refills | Status: DC | PRN

## 2017-05-14 MED ORDER — PAROXETINE HCL 40 MG PO TABS: 40.0000 mg | ORAL_TABLET | Freq: Every day | ORAL | 0 refills | Status: DC

## 2017-05-14 MED ORDER — NICOTINE 21 MG/24HR TD PT24: 21.0000 mg | MEDICATED_PATCH | Freq: Every day | TRANSDERMAL | 0 refills | Status: DC

## 2017-05-14 MED ORDER — GABAPENTIN 300 MG PO CAPS: 300.0000 mg | ORAL_CAPSULE | Freq: Three times a day (TID) | ORAL | 0 refills | Status: DC

## 2017-05-14 MED ORDER — ADULT MULTIVITAMIN W/MINERALS CH: 1.0000 | ORAL_TABLET | Freq: Every day | ORAL | 0 refills | Status: DC

## 2017-05-14 MED ORDER — ATENOLOL 100 MG PO TABS: 100.0000 mg | ORAL_TABLET | Freq: Every day | ORAL | 0 refills | Status: DC

## 2017-05-14 MED ORDER — TRAZODONE HCL 100 MG PO TABS
100.0000 mg | ORAL_TABLET | Freq: Every evening | ORAL | Status: DC | PRN
  Filled 2017-05-14: qty 14

## 2017-05-14 MED ORDER — PANTOPRAZOLE SODIUM 40 MG PO TBEC: 40.0000 mg | DELAYED_RELEASE_TABLET | Freq: Every day | ORAL | 0 refills | Status: DC

## 2017-05-14 NOTE — Progress Notes (Signed)
  [facility] Adult Case Management Discharge Plan :  Will you be returning to the same living situation after discharge:  Yes,  pt returning home. At discharge, do you have transportation home?: Yes,  pt has access to transportation. Do you have the ability to pay for your medications: Yes,  prescriptions and samples provided.  Release of information consent forms completed and in the chart;  Patient's signature needed at discharge.  Patient to Follow up at: Follow-up Information    [facility] Follow up on 05/16/2017.   Why:  Hospital discharge appointment 9/19 .  Contact information: [address] [address] [phone]           Next level of care provider has access to [facility] Link:no  Safety Planning and Suicide Prevention discussed: Yes,  with pt and with pt's mother.  Have you used any form of tobacco in the last 30 days? (Cigarettes, Smokeless Tobacco, Cigars, and/or Pipes): Yes  Has patient been referred to the Quitline?: Patient refused referral  [facility] Yes  Jonathon Jordan,  [name], LCSWA 05/14/2017, 11:19 AM

## 2017-05-14 NOTE — Progress Notes (Signed)
D.  Pt pleasant on approach, denies complaints at this time other than some anxiety.  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  Pt was positive for evening wrap up group, observed engaged in appropriate interaction with peers on the unit.  Pt denies SI/HI/AVH at this time.  A.  Support and encouragement offered, medication given as ordered   R. Pt remains safe on the unit, will continue to monitor.

## 2017-05-14 NOTE — Progress Notes (Signed)
Recreation Therapy Notes  Date: 05/14/17 Time: 0930 Location: 300 Dayroom  Group Topic: Stress Management  Goal Area(s) Addresses:  Patient will verbalize importance of using healthy stress management.  Patient will identify positive emotions associated with healthy stress management.   Behavioral Response: Engaged  Intervention: Stress Management  Activity :  Meditation.  LRT introduced the stress management technique of meditation.  Patients were to listen and follow along as Brown meditation played from the Calm app.    Education:  Stress Management, Discharge Planning.   Education Outcome: Acknowledges edcuation/In group clarification offered/Needs additional education  Clinical Observations/Feedback: Pt attended group.   Allen Brown, LRT/CTRS         Allen Brown 05/14/2017 11:57 AM 

## 2017-05-14 NOTE — [facility] Suicide Risk Assessment (Signed)
[facility] Discharge Suicide Risk Assessment   Principal Problem: MDD (major depressive disorder), recurrent severe, without psychosis (HCC) Discharge Diagnoses:  Substance Induced Mood Disorder Patient Active Problem List   Diagnosis Date Noted  . Substance use disorder [F19.90] 05/10/2017  . MDD (major depressive disorder), recurrent severe, without psychosis (HCC) [F33.2] 05/09/2017    Total Time spent with patient: 30 minutes  Musculoskeletal: Strength & Muscle Tone: within normal limits Gait & Station: normal Patient leans: N/A  Psychiatric Specialty Exam: Review of Systems  Constitutional: Negative.   HENT: Negative.   Eyes: Negative.   Respiratory: Negative.   Cardiovascular: Negative.   Gastrointestinal: Negative.   Genitourinary: Negative.   Musculoskeletal: Negative.   Skin: Negative.   Neurological: Negative.   Endo/Heme/Allergies: Negative.   Psychiatric/Behavioral: Negative for depression, hallucinations, memory loss and suicidal ideas. The patient is not nervous/anxious and does not have insomnia.     Blood pressure 126/86, pulse 79, temperature 98 F (36.7 C), temperature source Oral, resp. rate 18, height 6' 1.5" (1.867 m), weight 75.8 kg (167 lb), SpO2 99 %.Body mass index is 21.73 kg/m.  General Appearance: Neatly dressed, pleasant, engaging well and cooperative. Appropriate behavior. Not in any distress. Good relatedness. Not internally stimulated  Eye Contact::  Good  Speech:  Spontaneous, normal prosody. Normal tone and rate.   Volume:  Normal  Mood:  Euthymic  Affect:  Appropriate and Full Range  Thought Process:  Goal Directed  Orientation:  Full (Time, Place, and Person)  Thought Content:  Future oriented. No delusional theme. No preoccupation with violent thoughts. No negative ruminations. No obsession.  No hallucination in any modality.   Suicidal Thoughts:  No  Homicidal Thoughts:  No  Memory:  Immediate;   Good Recent;   Good Remote;   Good   Judgement:  Good  Insight:  Fair  Psychomotor Activity:  Normal  Concentration:  Good  Recall:  Good  Fund of Knowledge:Good  Language: Good  Akathisia:  Negative  Handed:    AIMS (if indicated):     Assets:  Communication Skills Desire for Improvement Physical Health Resilience Social Support Vocational/Educational  Sleep:  Number of Hours: 6.25  Cognition: WNL  ADL's:  Intact   Clinical Assessment::   41 y.o Caucasian male recently separated, recently lost his job, recently evicted by his mom.  Transferred from [facility] ER. Presented there via EMS. Called because he was feeling hopeless and worthless. He expressed severe depression and worsening suicidal thoughts. He reports daily use of alcohol.   Seen today. Says he is very excited because he just got a job. Says his wife set up the interview while he was here. Says he would be staying with his mom but his wife and a friend are picking him up today. Reports that he is in good spirits. Mood has lifted completely. Reports normal energy and interest. Sleep and appetite has returned to normal. He has completely come off alcohol. He is tolerating his medications well.  He is able to think clearly. He is able to focus on task. His thoughts are not crowded or racing. No evidence of mania. No hallucination in any modality. He is not making any delusional statement. No passivity of will/thought. He is fully in touch with reality. No thoughts of suicide. No thoughts of homicide. No violent thoughts. No overwhelming anxiety. No access to weapons.   Nursing staff reports that patient has been appropriate on the unit. Patient has been interacting well with peers. No behavioral issues.  Patient has not voiced any suicidal thoughts. Patient has not been observed to be internally stimulated. Patient has been adherent with treatment recommendations. Patient has been tolerating their medication well.   Patient was discussed at team. Team members feels  that patient is back to his baseline level of function. Team agrees with plan to discharge patient today.   Demographic Factors:  Caucasian  Loss Factors: NA  Historical Factors: Impulsivity  Risk Reduction Factors:   Sense of responsibility to family, Employed, Living with another person, especially a relative, Positive social support, Positive therapeutic relationship and Positive coping skills or problem solving skills  Continued Clinical Symptoms:   as above   Cognitive Features That Contribute To Risk:  None    Suicide Risk:  Minimal: No identifiable suicidal ideation. Patient is not having any thoughts of suicide at this time. Modifiable risk factors targeted during this admission includes depression and substance use. Demographical and historical risk factors cannot be modified. Patient is now engaging well. Patient is reliable and is future oriented. We have buffered patient's support structures. At this point, patient is at low risk of suicide. Patient is aware of the effects of psychoactive substances on decision making process. Patient has been provided with emergency contacts. Patient acknowledges to use resources provided if unforseen circumstances changes their current risk stratification.     Plan Of Care/Follow-up recommendations:  1. Continue current psychotropic medications 2. Mental health and addiction follow up as arranged.  3. Discharge in care of his family 4. Provided limited quantity of prescriptions  Georgiann Cocker, MD 05/14/2017, 10:01 AM

## 2017-05-14 NOTE — Discharge Summary (Signed)
Physician Discharge Summary Note  Patient:  Allen Brown is an 41 y.o., male MRN:  [id number] DOB:  December 16, 1975 Patient phone:  [phone] (home)  Patient address:   [address] [place] [address],  Total Time spent with patient: 30 minutes  Date of Admission:  05/09/2017 Date of Discharge: 05/14/2017  Reason for Admission:Per HPI- 41 y.o Caucasian male recently separated, recently lost his job, recently evicted by his mom.  Transferred from [facility] ER. Presented there via EMS. Called because he was feeling hopeless and worthless. He expressed severe depression and worsening suicidal thoughts. He reports daily use of alcohol.  At interview, patient reports that he has been drinking since his early teens. Says he also takes Clonazepam at daily. Alcohol has caused problems in is marriage. It affected his job. Patient was previously on probation for trespassing. Says he called the ambulance because he felt as if he was "at the end of the rope". Says he has been feeling very depressed. No interest or motivation to do things. Says all he does is drink alcohol. He had stopped taking his antidepressants for the past couple of weeks. Patient says he is not necessarily suicidal. Says he is just tired of the way his life is going. Says he sometime wishes that God would take his life. Patient denies any active suicidal thoughts. Denies any past suicidal behavior. Denies any thoughts of violence. Denies any homicidal thoughts. He has been started of Ativan protocol. No sweatiness, no headaches. No retching, nausea or vomiting. No fullness in the head. No visual, tactile or auditory hallucination. No internal restlessness. Anxiety is not overwhelming. No evidence of PTSD.  Principal Problem: MDD (major depressive disorder), recurrent severe, without psychosis [place]) Discharge Diagnoses: Patient Active Problem List   Diagnosis Date Noted  . Substance use disorder [F19.90] 05/10/2017  . MDD (major  depressive disorder), recurrent severe, without psychosis (HCC) [F33.2] 05/09/2017    Past Psychiatric History:   Past Medical History:  Past Medical History:  Diagnosis Date  . GERD (gastroesophageal reflux disease)   . Hypertension   . PTSD (post-traumatic stress disorder)     Past Surgical History:  Procedure Laterality Date  . ELBOW SURGERY     Family History: History reviewed. No pertinent family history. Family Psychiatric  History:  Social History:  History  Alcohol Use  . Yes     History  Drug Use No    Social History   Social History  . Marital status: Legally Separated    Spouse name: N/A  . Number of children: N/A  . Years of education: N/A   Social History Main Topics  . Smoking status: Current Every Day Smoker    Packs/day: 1.00  . Smokeless tobacco: Never Used  . Alcohol use Yes  . Drug use: No  . Sexual activity: Not Asked   Other Topics Concern  . None   Social History Narrative  . None    Hospital Course:  Allen Brown was admitted for MDD (major depressive disorder), recurrent severe, without psychosis (HCC) and crisis management.  Pt was treated discharged with the medications listed below under Medication List.  Medical problems were identified and treated as needed.  Home medications were restarted as appropriate.  Improvement was monitored by observation and Allen Brown 's daily report of symptom reduction.  Emotional and mental status was monitored by daily self-inventory reports completed by Allen Brown and clinical staff.         Allen Brown was evaluated by  the treatment team for stability and plans for continued recovery upon discharge. Allen Brown 's motivation was an integral factor for scheduling further treatment. Employment, transportation, bed availability, health status, family support, and any pending legal issues were also considered during hospital stay. Pt was offered further treatment options upon discharge  including but not limited to Residential, Intensive Outpatient, and Outpatient treatment.  Allen Brown with the services as listed below under Follow Brown Information.     Upon completion of this admission the patient was both mentally and medically stable for discharge denying suicidal/homicidal ideation, auditory/visual/tactile hallucinations, delusional thoughts and paranoia.    Allen Brown responded well to treatment with Neurontin 300 mg, Paxil 40 mg, Trazodone 100 mg  without adverse effects. Pt demonstrated improvement without reported or observed adverse effects to the point of stability appropriate for outpatient management. follow-Brown is necessary for lab recheck as mentioned below. Reviewed CBC, CMP, BAL, and UDS; all unremarkable aside from noted exceptions.   Physical Findings: AIMS: Facial and Oral Movements Muscles of Facial Expression: None, normal Lips and Perioral Area: None, normal Jaw: None, normal Tongue: None, normal,Extremity Movements Upper (arms, wrists, hands, fingers): None, normal Lower (legs, knees, ankles, toes): None, normal, Trunk Movements Neck, shoulders, hips: None, normal, Overall Severity Severity of abnormal movements (highest score from questions above): None, normal Incapacitation due to abnormal movements: None, normal Patient's awareness of abnormal movements (rate only patient's report): No Awareness, Dental Status Current problems with teeth and/or dentures?: No Does patient usually wear dentures?: No  CIWA:  CIWA-Ar Total: 2 COWS:     Musculoskeletal: Strength & Muscle Tone: within normal limits Gait & Station: normal Patient leans: N/A  Psychiatric Specialty Exam: See SRA by MD Physical Exam  Vitals reviewed. Constitutional: He is oriented to person, place, and time. He appears well-developed.  Cardiovascular: Normal rate.   Neurological: He is alert and oriented to person, place, and time.  Psychiatric: He has a normal  mood and affect. His behavior is normal.    Review of Systems  Psychiatric/Behavioral: Negative for depression (stable) and hallucinations (stable).    Blood pressure 126/86, pulse 79, temperature 98 F (36.7 C), temperature source Oral, resp. rate 18, height 6' 1.5" (1.867 m), weight 75.8 kg (167 lb), SpO2 99 %.Body mass index is 21.73 kg/m.   Have you used any form of tobacco in the last 30 days? (Cigarettes, Smokeless Tobacco, Cigars, and/or Pipes): Yes  Has this patient used any form of tobacco in the last 30 days? (Cigarettes, Smokeless Tobacco, Cigars, and/or Pipes) Yes, Yes, A prescription for an FDA-approved tobacco cessation medication was offered at discharge and the patient refused  Blood Alcohol level:  Lab Results  Component Value Date   ETH 223 (H) 11/30/2011    Metabolic Disorder Labs:  No results found for: HGBA1C, MPG No results found for: PROLACTIN No results found for: CHOL, TRIG, HDL, CHOLHDL, VLDL, LDLCALC  See Psychiatric Specialty Exam and Suicide Risk Assessment completed by Attending Physician prior to discharge.  Discharge destination:  Home  Is patient on multiple antipsychotic therapies at discharge:  No   Has Patient had three or more failed trials of antipsychotic monotherapy by history:  No  Recommended Plan for Multiple Antipsychotic Therapies: NA  Discharge Instructions    Diet - low sodium heart healthy    Complete by:  As directed    Discharge instructions    Complete by:  As directed    Take all medications as prescribed.  Keep all follow-Brown appointments as scheduled.  Do not consume alcohol or use illegal drugs while on prescription medications. Report any adverse effects from your medications to your primary care provider promptly.  In the event of recurrent symptoms or worsening symptoms, call 911, a crisis hotline, or go to the nearest emergency department for evaluation.   Increase activity slowly    Complete by:  As directed       Allergies as of 05/14/2017   No Known Allergies     Medication List    STOP taking these medications   clonazePAM 0.5 MG tablet Commonly known as:  KLONOPIN   omeprazole 20 MG capsule Commonly known as:  PRILOSEC Replaced by:  pantoprazole 40 MG tablet     TAKE these medications     Indication  atenolol 100 MG tablet Commonly known as:  TENORMIN Take 1 tablet (100 mg total) by mouth daily.  Indication:  High Blood Pressure Disorder   gabapentin 300 MG capsule Commonly known as:  NEURONTIN Take 1 capsule (300 mg total) by mouth 3 (three) times daily.  Indication:  Aggressive Behavior, Agitation   hydrOXYzine 25 MG tablet Commonly known as:  ATARAX/VISTARIL Take 1 tablet (25 mg total) by mouth 3 (three) times daily as needed for anxiety.  Indication:  Feeling Anxious   multivitamin with minerals Tabs tablet Take 1 tablet by mouth daily.  Indication:  mutiv   nicotine 21 mg/24hr patch Commonly known as:  NICODERM CQ - dosed in mg/24 hours Place 1 patch (21 mg total) onto the skin daily.  Indication:  Nicotine Addiction   pantoprazole 40 MG tablet Commonly known as:  PROTONIX Take 1 tablet (40 mg total) by mouth daily. Replaces:  omeprazole 20 MG capsule  Indication:  Gastroesophageal Reflux Disease   PARoxetine 40 MG tablet Commonly known as:  PAXIL Take 1 tablet (40 mg total) by mouth daily. What changed:  medication strength  how much to take  when to take this  Another medication with the same name was removed. Continue taking this medication, and follow the directions you see here.  Indication:  Depressive Phase of Manic-Depression   traZODone 100 MG tablet Commonly known as:  DESYREL Take 1 tablet (100 mg total) by mouth at bedtime as needed and may repeat dose one time if needed for sleep.  Indication:  Trouble Sleeping        Follow-Brown recommendations:  Activity:  as tolerated Diet:  heart healthy  Comments:  Take all medications as  prescribed. Keep all follow-Brown appointments as scheduled.  Do not consume alcohol or use illegal drugs while on prescription medications. Report any adverse effects from your medications to your primary care provider promptly.  In the event of recurrent symptoms or worsening symptoms, call 911, a crisis hotline, or go to the nearest emergency department for evaluation.   Signed: Oneta Rack, NP 05/14/2017, 9:24 AM

## 2017-05-14 NOTE — Progress Notes (Signed)
Patient discharged to lobby. Patient was stable and appreciative at that time. All papers, samples and prescriptions were given and valuables returned. Verbal understanding expressed. Denies SI/HI and A/VH. Patient given opportunity to express concerns and ask questions.  

## 2017-05-14 NOTE — Plan of Care (Signed)
Problem: Activity: Goal: Interest or engagement in activities will improve Outcome: Progressing Pt has been attending groups on the unit   

## 2017-09-26 ENCOUNTER — Other Ambulatory Visit: Payer: Self-pay

## 2017-09-26 ENCOUNTER — Encounter (HOSPITAL_COMMUNITY): Payer: Self-pay | Admitting: General Practice

## 2017-09-26 ENCOUNTER — Inpatient Hospital Stay (HOSPITAL_COMMUNITY)
Admission: AD | Admit: 2017-09-26 | Discharge: 2017-10-01 | DRG: 885 | Disposition: A | Discharge status: Home / Self Care | Payer: Federal, State, Local not specified - Other | Source: Other Acute Inpatient Hospital | Attending: Psychiatry | Admitting: Psychiatry

## 2017-09-26 DIAGNOSIS — F1290 Cannabis use, unspecified, uncomplicated: Secondary | ICD-10-CM | POA: Diagnosis not present

## 2017-09-26 DIAGNOSIS — F17210 Nicotine dependence, cigarettes, uncomplicated: Secondary | ICD-10-CM | POA: Diagnosis present

## 2017-09-26 DIAGNOSIS — R45851 Suicidal ideations: Secondary | ICD-10-CM | POA: Diagnosis present

## 2017-09-26 DIAGNOSIS — F410 Panic disorder [episodic paroxysmal anxiety] without agoraphobia: Secondary | ICD-10-CM | POA: Diagnosis not present

## 2017-09-26 DIAGNOSIS — Z590 Homelessness: Secondary | ICD-10-CM | POA: Diagnosis not present

## 2017-09-26 DIAGNOSIS — G4700 Insomnia, unspecified: Secondary | ICD-10-CM | POA: Diagnosis present

## 2017-09-26 DIAGNOSIS — Z630 Problems in relationship with spouse or partner: Secondary | ICD-10-CM

## 2017-09-26 DIAGNOSIS — F329 Major depressive disorder, single episode, unspecified: Secondary | ICD-10-CM | POA: Diagnosis present

## 2017-09-26 DIAGNOSIS — F331 Major depressive disorder, recurrent, moderate: Secondary | ICD-10-CM

## 2017-09-26 DIAGNOSIS — F1990 Other psychoactive substance use, unspecified, uncomplicated: Secondary | ICD-10-CM | POA: Diagnosis present

## 2017-09-26 DIAGNOSIS — F332 Major depressive disorder, recurrent severe without psychotic features: Secondary | ICD-10-CM | POA: Diagnosis present

## 2017-09-26 DIAGNOSIS — F1994 Other psychoactive substance use, unspecified with psychoactive substance-induced mood disorder: Secondary | ICD-10-CM | POA: Diagnosis not present

## 2017-09-26 DIAGNOSIS — R450 Nervousness: Secondary | ICD-10-CM | POA: Diagnosis not present

## 2017-09-26 MED ORDER — HYDROXYZINE HCL 25 MG PO TABS
25.0000 mg | ORAL_TABLET | Freq: Three times a day (TID) | ORAL | Status: DC | PRN
  Administered 2017-09-26 – 2017-09-30 (×13): 25 mg via ORAL
  Filled 2017-09-26 (×3): qty 1
  Filled 2017-09-26: qty 3
  Filled 2017-09-26 (×9): qty 1

## 2017-09-26 MED ORDER — ALUM & MAG HYDROXIDE-SIMETH 200-200-20 MG/5ML PO SUSP: 30.0000 mL | ORAL | Status: DC | PRN

## 2017-09-26 MED ORDER — NICOTINE 21 MG/24HR TD PT24
21.0000 mg | MEDICATED_PATCH | Freq: Every day | TRANSDERMAL | Status: DC
  Administered 2017-09-26 – 2017-10-01 (×6): 21 mg via TRANSDERMAL
  Filled 2017-09-26 (×8): qty 1

## 2017-09-26 MED ORDER — ACETAMINOPHEN 325 MG PO TABS
650.0000 mg | ORAL_TABLET | Freq: Four times a day (QID) | ORAL | Status: DC | PRN
  Administered 2017-09-26 – 2017-10-01 (×10): 650 mg via ORAL
  Filled 2017-09-26: qty 4
  Filled 2017-09-26 (×8): qty 2

## 2017-09-26 MED ORDER — ENSURE ENLIVE PO LIQD
237.0000 mL | Freq: Two times a day (BID) | ORAL | Status: DC
  Administered 2017-09-26 – 2017-10-01 (×8): 237 mL via ORAL

## 2017-09-26 MED ORDER — PAROXETINE HCL 20 MG PO TABS
20.0000 mg | ORAL_TABLET | Freq: Every day | ORAL | Status: DC
  Filled 2017-09-26 (×2): qty 1

## 2017-09-26 MED ORDER — MIRTAZAPINE 15 MG PO TABS
15.0000 mg | ORAL_TABLET | Freq: Every day | ORAL | Status: DC
  Administered 2017-09-26 – 2017-09-30 (×5): 15 mg via ORAL
  Filled 2017-09-26 (×6): qty 1
  Filled 2017-09-26: qty 7
  Filled 2017-09-26 (×2): qty 1

## 2017-09-26 MED ORDER — TRAZODONE HCL 50 MG PO TABS: 50.0000 mg | ORAL_TABLET | Freq: Every evening | ORAL | Status: DC | PRN

## 2017-09-26 MED ORDER — MAGNESIUM HYDROXIDE 400 MG/5ML PO SUSP: 30.0000 mL | Freq: Every day | ORAL | Status: DC | PRN

## 2017-09-26 MED ORDER — PAROXETINE HCL 20 MG PO TABS
20.0000 mg | ORAL_TABLET | Freq: Every day | ORAL | Status: DC
  Administered 2017-09-26 – 2017-10-01 (×6): 20 mg via ORAL
  Filled 2017-09-26 (×4): qty 1
  Filled 2017-09-26: qty 7
  Filled 2017-09-26 (×4): qty 1

## 2017-09-26 MED ORDER — PAROXETINE HCL 20 MG PO TABS
20.0000 mg | ORAL_TABLET | Freq: Every day | ORAL | Status: DC
Start: 2017-09-26 — End: 2017-09-26

## 2017-09-26 NOTE — [facility] Group Notes (Signed)
[facility] Group Therapy 09/26/2017 1:15pm  Type of Therapy: [facility] [employer] Presentation  Participation Level: Invited. DID NOT ATTEND. Pt chose to remain in bed.   Summary of Progress/Problems: [facility] [employer] ([facility]) Speaker came to talk about his personal journey with mental health. The pt processed ways by which to relate to the speaker. MHA speaker provided handouts and educational information pertaining to groups and services offered by the MHA. Pt was engaged in speaker's presentation and was receptive to resources provided.    Zaakirah Kistner N Smart, LCSW 09/26/2017 10:19 AM  

## 2017-09-26 NOTE — [facility] Suicide Risk Assessment (Signed)
[facility]   Nursing information obtained from:    Demographic factors:    Current Mental Status:    Loss Factors:    Historical Factors:    Risk Reduction Factors:     Total Time spent with patient: 30 minutes Principal Problem: MDD Diagnosis:   Patient Active Problem List   Diagnosis Date Noted  . MDD (major depressive disorder) [F32.9] 09/26/2017  . Substance use disorder [F19.90] 05/10/2017  . MDD (major depressive disorder), recurrent severe, without psychosis (HCC) [F33.2] 05/09/2017   Subjective Data:  4I y.o Caucasian male, homeless, recently separated from long term girlfriend a few days ago. [facility]. Presented voluntarily in company of his daughter. Was Involuntarily committed there as her family had concerns about his personal safety. He is reported to have been actively searching for a gun. He expressed plans to kill himself. He has been off his antidepressant medication for several weeks. He has been drinking lately. BAL 21 mg/dl. UDS is negative. Patient reports that he had stopped taking his antidepressants when he ran out. Says after he was discharged in September 2018, he reconnected with his girlfriend. Says he got another job and moved in with her. Says he had been doing well until he just found out that she is seeing another person. Says she asked him to move out. Patient says he has been angry and irritable since then. He is in disbelief. Says he has not been sleeping well. He has lost appetite. He feels restless and disillusioned. Patient denies any suicidal thoughts or plans. Says his daughter used that as a means of getting him into the hospital. Says he would never do that to his family. He is angry at his ex and her new lover. Denies any homicidal or violent thoughts towards them. Says he had been off substances but started drinking after the separation. Says he misses her two year old child. No associated psychosis. No  evidence of mania. No access to weapons. Patient is willing to take paroxetine again. We discussed use of low dose Mirtazapine at bedtime. He consented to treatment after we reviewed the risks and benefits.    Continued Clinical Symptoms:  Alcohol Use Disorder Identification Test Final Score (AUDIT): 6 The "Alcohol Use Disorders Identification Test", Guidelines for Use in Primary Care, Second Edition.  World Science writerHealth Organization [facility](WHO). Score between 0-7:  no or low risk or alcohol related problems. Score between 8-15:  moderate risk of alcohol related problems. Score between 16-19:  high risk of alcohol related problems. Score 20 or above:  warrants further diagnostic evaluation for alcohol dependence and treatment.   CLINICAL FACTORS:   Depression:   Hopelessness Alcohol/Substance Abuse/Dependencies   Musculoskeletal: Strength & Muscle Tone: within normal limits Gait & Station: normal Patient leans: N/A  Psychiatric Specialty Exam: Physical Exam  Constitutional: He is oriented to person, place, and time. He appears well-developed and well-nourished.  HENT:  Head: Normocephalic.  Respiratory: Effort normal.  Neurological: He is alert and oriented to person, place, and time.  Psychiatric:  As above.     ROS   Blood pressure 114/73, pulse (!) 120, temperature 98.5 F (36.9 C), temperature source Oral, resp. rate 18, height 6' 0.5" (1.842 m), weight 76.3 kg (168 lb 4 oz).Body mass index is 22.51 kg/m.  General Appearance: neatly dressed. Not shaky, not sweaty, not confused. Not unsteady, normal conjugate eye movements. Not internally distressed. Appropriate behavior.   Eye Contact:  Good  Speech:  Normal rate and tone  Volume:  Normal   Mood:  Depressed and worried  Affect:  Restricted and mood congruent  Thought Process:  Linear and goal directed  Orientation:  WNL  Thought Content:  Negative ruminations. No delusional theme. No preoccupation with violent thoughts. No  hallucination in any modality.   Suicidal Thoughts:  Denies  Homicidal Thoughts: None  Memory:  WNL  Judgement: Fair  Insight:  Good  Psychomotor Activity:  Restless  Concentration:  Good  Recall:  Good  Fund of Knowledge:  Good  Language:  Normal  Akathisia:  NA  Handed:    AIMS (if indicated):     Assets:  Physically health  ADL's:  Intact   Cognition:  WNL  Sleep:         COGNITIVE FEATURES THAT CONTRIBUTE TO RISK:  None    SUICIDE RISK:   Minimal: No identifiable suicidal ideation.  Patients presenting with no risk factors but with morbid ruminations; may be classified as minimal risk based on the severity of the depressive symptoms  PLAN OF CARE:  1. Alcohol withdrawal protocol 2. Q 15 minute check for suicide. 3. Paroxetine 20 mg HS 4. Mirtazapine 15 mg HS.  5. PRN hydroxyzine for anxiety 6. SW would gather collateral from his family   I certify that inpatient services furnished can reasonably be expected to improve the patient's condition.   Georgiann Cocker, MD 09/26/2017, 3:07 PM
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
## 2017-09-26 NOTE — Tx Team (Addendum)
Initial Treatment Plan 09/26/2017 11:13 AM Allen Brown ZOX:[id number]    PATIENT STRESSORS: Marital or family conflict Medication change or noncompliance Occupational concerns   PATIENT STRENGTHS: Wellsite geologistCommunication skills General fund of knowledge   PATIENT IDENTIFIED PROBLEMS: Depression, Anxiety, family conflict  Pt minimizing substance abuse   1. "Don't want to drink again"  2. "Do what I need to do to get back to my job"                DISCHARGE CRITERIA:  Improved stabilization in mood, thinking, and/or behavior Verbal commitment to aftercare and medication compliance Withdrawal symptoms are absent or subacute and managed without 24-hour nursing intervention  PRELIMINARY DISCHARGE PLAN: Outpatient therapy Participate in family therapy  PATIENT/FAMILY INVOLVEMENT: This treatment plan has been presented to and reviewed with the patient, Allen Brown.  The patient has been given the opportunity to ask questions and make suggestions.  Larina Earthlyopson, Mathis Cashman E, RN 09/26/2017, 11:13 AM

## 2017-09-26 NOTE — BH Assessment (Signed)
Assessment Note  Allen PenceJeremy Brown is a 42 y.o. male who is an out of system referral from [facility].  [facility]  Accepting is Dr. Lucianne MussKumar.  Attending is Dr. Jama Brown.  Below is a copy of the narrative completed by TTS:  The chief complaint is that Pt's daughter took out IVC on patient stating that he was walking the streets looking for a gun so that he could kill himself.  Allen RuskJeremy is a [age] yeara old mael who [facility] reporting symptoms of depression and suicidal ideation.  Pt has a history of depression and anxiety and says he was referred for assessment by his daughter.  Pt denies taking any medications currently due to not being able to see his psychiatrist.  Pt denies current suicidal ideation.  Pt denies any past attempts.  Pt acknowledges symptoms including: sadness, fatigue, guilt, low self-esteem, tearfulness, isolation, lack of motivation, irritability, difficulty concentrating, hopelessness, sleeping less, eating less, and occasional nightmares and flashbacks.  Pt denies homicidal ideation/history of violence.  Pt denies auditory or visual hallucinations or other psychotic symptoms.  Pt states current stressor is his recent breakup with girlfriend of over 10 years.  Pt lives with his friends and his mother, and denies having supports right now.  Pt denies history of abuse and trauma.  Pt denies family history of SI/MH/SA. 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  Pt has poor insight and partial judgment.  Pt's memory is intact.  Pt reports having a pending trespassing charge and an upcoming court date of February 18 or 19th 2019.  Pt's OP history includes seeing Joaquin [place]inda [place] management.  However he hasn't seen her since Fall 2017.  Pt denies IP history.  Pt reports alcohol however, he denies any substance abuse although his labs are positive for  methadone and marijuana.  Pt is dressed in scrubs, alert and oriented x4 with normal speech and normal motor behavior.  Eye contact is good.  Pt's mood is depressed and apprehensive and affect is depressed and apprehensive.  Affect is congruent with mood.  Thought process is coherent and relevant.  There is no indication Pt is currently responding to internal stimuli or experiencing delusional thought content.  Pt was cooperative throughout assessment.  Pt is currently able to contract for safety outside the hospital.  Diagnosis: F33.1 Major depressive disorder, recurrent episodoe, moderate; F10.24 Alcohol-induced depressive disorder, with moderate or severe use disorder  Past Medical History:  Past Medical History:  Diagnosis Date  . GERD (gastroesophageal reflux disease)   . Hypertension   . PTSD (post-traumatic stress disorder)     Past Surgical History:  Procedure Laterality Date  . ELBOW SURGERY      Family History: No family history on file.  Social History:  reports that he has been smoking.  He has been smoking about 1.00 pack per day. he has never used smokeless tobacco. He reports that he drinks alcohol. He reports that he does not use drugs.  Additional Social History:  Alcohol / Drug Use Pain Medications: See MAR Prescriptions: See MAR Over the Counter: See MAR Substance #1 Name of Substance 1: Alcohol 1 - Last Use / Amount: BAC on admission was .21  CIWA:   COWS:    Allergies: No Known Allergies  Home Medications:  Medications Prior to Admission  Medication Sig Dispense Refill  . atenolol (TENORMIN) 100  MG tablet Take 1 tablet (100 mg total) by mouth daily. 30 tablet 0  . gabapentin (NEURONTIN) 300 MG capsule Take 1 capsule (300 mg total) by mouth 3 (three) times daily. 90 capsule 0  . hydrOXYzine (ATARAX/VISTARIL) 25 MG tablet Take 1 tablet (25 mg total) by mouth 3 (three) times daily as needed for anxiety. 30 tablet 0  . Multiple Vitamin (MULTIVITAMIN WITH  MINERALS) TABS tablet Take 1 tablet by mouth daily. 30 tablet 0  . nicotine (NICODERM CQ - DOSED IN MG/24 HOURS) 21 mg/24hr patch Place 1 patch (21 mg total) onto the skin daily. 28 patch 0  . pantoprazole (PROTONIX) 40 MG tablet Take 1 tablet (40 mg total) by mouth daily. 30 tablet 0  . PARoxetine (PAXIL) 40 MG tablet Take 1 tablet (40 mg total) by mouth daily. 30 tablet 0  . traZODone (DESYREL) 100 MG tablet Take 1 tablet (100 mg total) by mouth at bedtime as needed and may repeat dose one time if needed for sleep. 30 tablet 0    OB/GYN Status:  No LMP for male patient.  General Assessment Data Location of Assessment: [facility]) TTS Assessment: Out of system Is this a Tele or Face-to-Face Assessment?: Face-to-Face Is this an Initial Assessment or a Re-assessment for this encounter?: Initial Assessment Marital status: Other (comment)(Recently broke up with girlfriend) Is patient pregnant?: No Pregnancy Status: No Living Arrangements: Parent, Non-relatives/Friends(Mother and freind) Can pt return to current living arrangement?: Yes Admission Status: Involuntary(Petition completed by daughter) Is patient capable of signing voluntary admission?: Yes Referral Source: Self/Family/Friend Insurance type: Self pay     Crisis Care Plan Living Arrangements: Parent, Non-relatives/Friends(Mother and freind) Name of Psychiatrist: None currently(Last saw "Joaquin Courts" for med mgt in Fall 2017) Name of Therapist: Unknown  Education Status Is patient currently in school?: No  Risk to self with the past 6 months Suicidal Ideation: Yes-Currently Present Suicidal Intent: Yes-Currently Present Is patient at risk for suicide?: Yes Suicidal Plan?: Yes-Currently Present Has patient had any suicidal plan within the past 6 months prior to admission? : Yes(gun; but see notes) Specify Current Suicidal Plan: Shoot self with gun Access to Means: Yes What has been your use of  drugs/alcohol within the last 12 months?: Alcohol; marijuana; methadone Previous Attempts/Gestures: No Intentional Self Injurious Behavior: None Family Suicide History: Unknown Recent stressful life event(s): Loss (Comment)(Broke up w/girlfriend of 10 years; off medication) Persecutory voices/beliefs?: No Depression: Yes Depression Symptoms: Tearfulness, Feeling worthless/self pity, Despondent, Fatigue, Isolating, Feeling angry/irritable, Insomnia Substance abuse history and/or treatment for substance abuse?: Yes Suicide prevention information given to non-admitted patients: Not applicable  Risk to Others within the past 6 months Homicidal Ideation: No Does patient have any lifetime risk of violence toward others beyond the six months prior to admission? : No Thoughts of Harm to Others: No Current Homicidal Intent: No Current Homicidal Plan: No Access to Homicidal Means: No History of harm to others?: No Assessment of Violence: None Noted Does patient have access to weapons?: (Unknown) Criminal Charges Pending?: Yes Describe Pending Criminal Charges: 1st degree misdemeanor trespassing Does patient have a court date: Yes Court Date: 10/16/17 Is patient on probation?: Unknown  Psychosis Hallucinations: None noted Delusions: None noted  Mental Status Report Appearance/Hygiene: Disheveled, Poor hygiene Eye Contact: Good Motor Activity: Unable to assess Speech: Unremarkable Level of Consciousness: Alert Mood: Sad, Anxious Affect: Appropriate to circumstance, Irritable Anxiety Level: Panic Attacks Panic attack frequency: (Unknown) Most recent panic attack: (Unknown) Thought Processes: Coherent, Relevant  Judgement: Impaired Orientation: Person, Place, Time, Situation Obsessive Compulsive Thoughts/Behaviors: None  Cognitive Functioning Concentration: Unable to Assess Memory: Remote Intact, Recent Intact IQ: Average Insight: Poor Impulse Control: Poor Sleep:  Decreased Vegetative Symptoms: Unable to Assess  ADLScreening [facility]) Patient's cognitive ability adequate to safely complete daily activities?: Yes Patient able to express need for assistance with ADLs?: Yes Independently performs ADLs?: Yes (appropriate for developmental age)  Prior Inpatient Therapy Prior Inpatient Therapy: (Unknown)  Prior Outpatient Therapy Prior Outpatient Therapy: Yes Prior Therapy Dates: 2017 Does patient have an ACCT team?: Unknown Does patient have Intensive In-House Services?  : Unknown Does patient have [facility] services? : Unknown Does patient have P4CC services?: Unknown  ADL Screening (condition at time of admission) Patient's cognitive ability adequate to safely complete daily activities?: Yes Is the patient deaf or have difficulty hearing?: No Does the patient have difficulty seeing, even when wearing glasses/contacts?: No Does the patient have difficulty concentrating, remembering, or making decisions?: No Patient able to express need for assistance with ADLs?: Yes Does the patient have difficulty dressing or bathing?: No Independently performs ADLs?: Yes (appropriate for developmental age) Does the patient have difficulty walking or climbing stairs?: No Weakness of Legs: None Weakness of Arms/Hands: None  Home Assistive Devices/Equipment Home Assistive Devices/Equipment: None  Therapy Consults (therapy consults require a physician order) PT Evaluation Needed: No OT Evalulation Needed: No SLP Evaluation Needed: No Abuse/Neglect Assessment (Assessment to be complete while patient is alone) Abuse/Neglect Assessment Can Be Completed: Yes Physical Abuse: Denies Verbal Abuse: Denies Sexual Abuse: Denies Exploitation of patient/patient's resources: Denies Self-Neglect: Denies Values / Beliefs Cultural Requests During Hospitalization: None Spiritual Requests During Hospitalization: None Consults Spiritual Care Consult  Needed: No Social Work Consult Needed: No Merchant navy officer (For Healthcare) Does Patient Have a Medical Advance Directive?: No    Additional Information 1:1 In Past 12 Months?: No CIRT Risk: No Elopement Risk: No Does patient have medical clearance?: Yes     Disposition:  Disposition Initial Assessment Completed for this Encounter: Yes Disposition of Patient: Inpatient treatment program Type of inpatient treatment program: Adult(Pt accepted to [facility] 406-2)  On Site Evaluation by:   Reviewed with Physician:    Dorris Fetch Cariah Salatino 09/26/2017 10:38 AM

## 2017-09-26 NOTE — Progress Notes (Signed)
Patient ID: Allen Brown, male   DOB: Nov 03, 1975, 42 y.o.   MRN: [id number]  D: Patient pacing in the hallway on approach. Pt reports feeling increase anxiety after  incidents that brought him here. Pt reports his "nerves are shocked". Pt reports ongoing issues with wife. Denies  SI/HI/AVH.   Pt attended evening wrap up group and Interacted appropriately with peers. No behavioral issues noted.  A: Support and encouragement offered as needed to express needs. Medications administered as prescribed.  R: Patient is safe and cooperative on unit. Will continue to monitor  for safety and stability.

## 2017-09-26 NOTE — Progress Notes (Signed)
Adult Psychoeducational Group Note  Date:  09/26/2017 Time:  7:16 PM  Group Topic/Focus:  Healthy Communication:   The focus of this group is to discuss communication, barriers to communication, as well as healthy ways to communicate with others.  Participation Level:  Minimal  Participation Quality:  Appropriate  Affect:  Appropriate  Cognitive:  Appropriate  Insight: Lacking  Engagement in Group:  Lacking  Modes of Intervention:  Activity  Additional Comments:  Pt minimally participated in group activity and discussion. Pt however needed to step out of group to make important phone call.  Phynix Horton R Maloni Musleh 09/26/2017, 7:16 PM
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
## 2017-09-26 NOTE — B[facility] Suicide Risk Assessment (Signed)
[facility] INPATIENT:  Family/Significant Other Suicide Prevention Education  Suicide Prevention Education:  Contact Attempts: patient's mother, Allen Brown [phone]) has been identified by the patient as the family member/significant other with whom the patient will be residing, and identified as the person(s) who will aid the patient in the event of a mental health crisis.  With written consent from the patient, two attempts were made to provide suicide prevention education, prior to and/or following the patient's discharge.  We were unsuccessful in providing suicide prevention education.  A suicide education pamphlet was given to the patient to share with family/significant other.  Date and time of first attempt: 09/26/17 at 2:30pm. The person who answered the phone stated that Allen CarnesJanice Brown was not available at this time and did not specify a better time to call back. Writer will follow up.   Date and time of second attempt: 09/27/17 at 3:35pm VM did not specify owner's name. Writer left a VM requesting a returned call.   Allen Brown 09/26/2017, 2:31 PM

## 2017-09-27 DIAGNOSIS — F39 Unspecified mood [affective] disorder: Secondary | ICD-10-CM

## 2017-09-27 DIAGNOSIS — R450 Nervousness: Secondary | ICD-10-CM

## 2017-09-27 DIAGNOSIS — F419 Anxiety disorder, unspecified: Secondary | ICD-10-CM

## 2017-09-27 DIAGNOSIS — F332 Major depressive disorder, recurrent severe without psychotic features: Principal | ICD-10-CM

## 2017-09-27 LAB — HEMOGLOBIN A1C
Hgb A1c MFr Bld: 5.3 % (ref 4.8–5.6)
MEAN PLASMA GLUCOSE: 105.41 mg/dL

## 2017-09-27 LAB — LIPID PANEL
Cholesterol: 156 mg/dL (ref 0–200)
HDL: 37 mg/dL — ABNORMAL LOW (ref >40)
LDL CALC: 102 mg/dL — AB (ref 0–99)
Total CHOL/HDL Ratio: 4.2 RATIO
Triglycerides: 86 mg/dL (ref <150)
VLDL: 17 mg/dL (ref 0–40)

## 2017-09-27 LAB — TSH: TSH: 0.724 u[IU]/mL (ref 0.350–4.500)

## 2017-09-27 MED ORDER — GABAPENTIN 300 MG PO CAPS
300.0000 mg | ORAL_CAPSULE | Freq: Three times a day (TID) | ORAL | Status: DC
  Administered 2017-09-27 – 2017-09-28 (×2): 300 mg via ORAL
  Filled 2017-09-27 (×5): qty 1

## 2017-09-27 MED ORDER — BACITRACIN-NEOMYCIN-POLYMYXIN OINTMENT TUBE
TOPICAL_OINTMENT | CUTANEOUS | Status: DC | PRN
  Administered 2017-09-27: 15:00:00 via TOPICAL
  Filled 2017-09-27: qty 14.17
  Filled 2017-09-27: qty 1

## 2017-09-27 MED ORDER — GABAPENTIN 600 MG PO TABS
300.0000 mg | ORAL_TABLET | Freq: Three times a day (TID) | ORAL | Status: DC
  Filled 2017-09-27 (×5): qty 0.5

## 2017-09-27 MED ORDER — MIRTAZAPINE 15 MG PO TABS
15.0000 mg | ORAL_TABLET | Freq: Once | ORAL | Status: DC
  Filled 2017-09-27 (×2): qty 1

## 2017-09-27 NOTE — Progress Notes (Signed)
Nutrition Brief Note  Patient identified on the Malnutrition Screening Tool (MST) Report  Wt Readings from Last 15 Encounters:  09/26/17 168 lb 4 oz (76.3 kg)  05/09/17 167 lb (75.8 kg)  11/30/11 218 lb (98.9 kg)    Body mass index is 22.51 kg/m. Patient meets criteria for normal weight based on current BMI. Weight has been stable for at least the past 4 months. Pt has lost 50 lbs (23% body weight) in the past nearly 6 years; this is not significant for time frame.   He is IVC for depression and SI. Pt with hx of depression and anxiety and has been off of psych medications for unknown period of time. Current diet order is Regular, patient is eating as desired for meals and snacks at this time. Labs and medications reviewed.   No nutrition interventions warranted at this time. If nutrition issues arise, please consult RD.     Trenton GammonJessica Kahli Fitzgerald, MS, RD, LDN, [facility] Inpatient Clinical Dietitian Pager # [phone] After hours/weekend pager # [phone]

## 2017-09-27 NOTE — Progress Notes (Signed)
Pt attend warp up group. His day was 6. His goal trying to stay stress free. His back has hurt all day today and he just want to make it thru the day. He trying to keep his medication on track.

## 2017-09-27 NOTE — Progress Notes (Signed)
[facility] MD Progress Note  09/27/2017 1:52 PM Allen Brown  MRN:  [id number]   Subjective:  Patient reports that he is doing ok today. He still has some depression rated at 6/10 and anxiety rated at 7/10. He reports having some agitation and that Gabapentin has helped in the past. He reports sleeping and eating good and denies any medication side effects. He denies any SI/HI/AVH and contracts for safety.  Objective: Patient's chart and findings reviewed and discussed with treatment team. Patient presents in the milieu and he has been cooperative and pleasant. He has been in the day room interacting appropriately. Will start Gabapentin 300 mg TID to assist with agitation and anxiety. Will continue all other medications.  Principal Problem: MDD (major depressive disorder), recurrent severe, without psychosis (HCC) Diagnosis:   Patient Active Problem List   Diagnosis Date Noted  . MDD (major depressive disorder) [F32.9] 09/26/2017  . Substance use disorder [F19.90] 05/10/2017  . MDD (major depressive disorder), recurrent severe, without psychosis (HCC) [F33.2] 05/09/2017   Total Time spent with patient: 25 minutes  Past Psychiatric History: See H&P  Past Medical History:  Past Medical History:  Diagnosis Date  . GERD (gastroesophageal reflux disease)   . Hypertension   . PTSD (post-traumatic stress disorder)     Past Surgical History:  Procedure Laterality Date  . ELBOW SURGERY     Family History: History reviewed. No pertinent family history. Family Psychiatric  History: See H&P Social History:  Social History   Substance and Sexual Activity  Alcohol Use Yes   Comment: 1 40 oz in the last month      Social History   Substance and Sexual Activity  Drug Use Yes  . Types: Marijuana    Social History   Socioeconomic History  . Marital status: Legally Separated    Spouse name: None  . Number of children: None  . Years of education: None  . Highest education level: None   Social Needs  . Financial resource strain: None  . Food insecurity - worry: None  . Food insecurity - inability: None  . Transportation needs - medical: None  . Transportation needs - non-medical: None  Occupational History  . None  Tobacco Use  . Smoking status: Current Every Day Smoker    Packs/day: 1.00  . Smokeless tobacco: Never Used  Substance and Sexual Activity  . Alcohol use: Yes    Comment: 1 40 oz in the last month   . Drug use: Yes    Types: Marijuana  . Sexual activity: Yes    Birth control/protection: None  Other Topics Concern  . None  Social History Narrative  . None   Additional Social History:    Pain Medications: See MAR Prescriptions: See MAR Over the Counter: See MAR Name of Substance 1: Alcohol 1 - Last Use / Amount: BAC on admission was .21                  Sleep: Good  Appetite:  Good  Current Medications: Current Facility-Administered Medications  Medication Dose Route Frequency Provider Last Rate Last Dose  . acetaminophen (TYLENOL) tablet 650 mg  650 mg Oral Q6H PRN Okonkwo, Justina A, NP   650 mg at 09/27/17 1115  . alum & mag hydroxide-simeth (MAALOX/MYLANTA) 200-200-20 MG/5ML suspension 30 mL  30 mL Oral Q4H PRN Okonkwo, Justina A, NP      . feeding supplement (ENSURE ENLIVE) (ENSURE ENLIVE) liquid 237 mL  237 mL Oral BID BM  Georgiann CockerIzediuno, Vincent A, MD   237 mL at 09/26/17 1409  . gabapentin (NEURONTIN) tablet 300 mg  300 mg Oral TID Money, Gerlene Burdockravis B, FNP      . hydrOXYzine (ATARAX/VISTARIL) tablet 25 mg  25 mg Oral TID PRN Beryle Lathekonkwo, Justina A, NP   25 mg at 09/27/17 1315  . magnesium hydroxide (MILK OF MAGNESIA) suspension 30 mL  30 mL Oral Daily PRN Okonkwo, Justina A, NP      . mirtazapine (REMERON) tablet 15 mg  15 mg Oral QHS Izediuno, Delight OvensVincent A, MD   15 mg at 09/26/17 2248  . mirtazapine (REMERON) tablet 15 mg  15 mg Oral Once Donell SievertSimon, Spencer E, PA-C      . nicotine (NICODERM CQ - dosed in mg/24 hours) patch 21 mg  21 mg  Transdermal Daily Izediuno, Delight OvensVincent A, MD   21 mg at 09/27/17 [id number]  . PARoxetine (PAXIL) tablet 20 mg  20 mg Oral Daily Starkes, Takia S, FNP   20 mg at 09/27/17 [id number]    Lab Results:  Results for orders placed or performed during the hospital encounter of 09/26/17 (from the past 48 hour(s))  TSH     Status: None   Collection Time: 09/27/17  6:33 AM  Result Value Ref Range   TSH 0.724 0.350 - 4.500 uIU/mL    Comment: Performed by a 3rd Generation assay with a functional sensitivity of <=0.01 uIU/mL. Performed at [facility], [address] [place], [place] [address]   Hemoglobin A1c     Status: None   Collection Time: 09/27/17  6:33 AM  Result Value Ref Range   Hgb A1c MFr Bld 5.3 4.8 - 5.6 %    Comment: (NOTE) Pre diabetes:          5.7%-6.4% Diabetes:              >6.4% Glycemic control for   <7.0% adults with diabetes    Mean Plasma Glucose 105.41 mg/dL    Comment: Performed at [facility], [address] [place], [place] [address]  Lipid panel     Status: Abnormal   Collection Time: 09/27/17  6:33 AM  Result Value Ref Range   Cholesterol 156 0 - 200 mg/dL   Triglycerides 86 <865<150 mg/dL   HDL 37 (L) >78>40 mg/dL   Total CHOL/HDL Ratio 4.2 RATIO   VLDL 17 0 - 40 mg/dL   LDL Cholesterol 469102 (H) 0 - 99 mg/dL    Comment:        Total Cholesterol/HDL:CHD Risk Coronary Heart Disease Risk Table                     Men   Women  1/2 Average Risk   3.4   3.3  Average Risk       5.0   4.4  2 X Average Risk   9.6   7.1  3 X Average Risk  23.4   11.0        Use the calculated Patient Ratio above and the CHD Risk Table to determine the patient's CHD Risk.        ATP III CLASSIFICATION (LDL):  <100     mg/dL   Optimal  [id number]  mg/dL   Near or Above                    Optimal  130-159  mg/dL   Borderline  [id number]  mg/dL   High  >  190     mg/dL   Very High Performed at [facility], [address] [place], [place] [address]      Blood Alcohol level:  Lab Results  Component Value Date   ETH 223 (H) 11/30/2011    Metabolic Disorder Labs: Lab Results  Component Value Date   HGBA1C 5.3 09/27/2017   MPG 105.41 09/27/2017   No results found for: PROLACTIN Lab Results  Component Value Date   CHOL 156 09/27/2017   TRIG 86 09/27/2017   HDL 37 (L) 09/27/2017   CHOLHDL 4.2 09/27/2017   VLDL 17 09/27/2017   LDLCALC 102 (H) 09/27/2017    Physical Findings: AIMS: Facial and Oral Movements Muscles of Facial Expression: None, normal Lips and Perioral Area: None, normal Jaw: None, normal Tongue: None, normal,Extremity Movements Upper (arms, wrists, hands, fingers): None, normal Lower (legs, knees, ankles, toes): None, normal, Trunk Movements Neck, shoulders, hips: None, normal, Overall Severity Severity of abnormal movements (highest score from questions above): None, normal Incapacitation due to abnormal movements: None, normal Patient's awareness of abnormal movements (rate only patient's report): No Awareness, Dental Status Current problems with teeth and/or dentures?: No Does patient usually wear dentures?: No  CIWA:    COWS:     Musculoskeletal: Strength & Muscle Tone: within normal limits Gait & Station: normal Patient leans: N/A  Psychiatric Specialty Exam: Physical Exam  Vitals reviewed. Constitutional: He is oriented to person, place, and time. He appears well-developed and well-nourished.  Respiratory: Effort normal.  Musculoskeletal: Normal range of motion.  Neurological: He is oriented to person, place, and time.  Skin: Skin is warm.    Review of Systems  Constitutional: Negative.   HENT: Negative.   Eyes: Negative.   Respiratory: Negative.   Cardiovascular: Negative.   Gastrointestinal: Negative.   Genitourinary: Negative.   Musculoskeletal: Negative.   Skin:       Multiple lacerations on upper extremities  Neurological: Negative.   Psychiatric/Behavioral: Positive for  depression. Negative for hallucinations and suicidal ideas. The patient is nervous/anxious.     Blood pressure 112/75, pulse (!) 106, temperature 97.9 F (36.6 C), temperature source Oral, resp. rate 20, height 6' 0.5" (1.842 m), weight 76.3 kg (168 lb 4 oz).Body mass index is 22.51 kg/m.  General Appearance: Casual  Eye Contact:  Good  Speech:  Clear and Coherent and Normal Rate  Volume:  Normal  Mood:  Depressed  Affect:  Congruent  Thought Process:  Goal Directed and Descriptions of Associations: Intact  Orientation:  Full (Time, Place, and Person)  Thought Content:  WDL  Suicidal Thoughts:  No  Homicidal Thoughts:  No  Memory:  Immediate;   Good Recent;   Good Remote;   Good  Judgement:  Good  Insight:  Good  Psychomotor Activity:  Normal  Concentration:  Concentration: Good and Attention Span: Good  Recall:  Good  Fund of Knowledge:  Good  Language:  Good  Akathisia:  No  Handed:  Right  AIMS (if indicated):     Assets:  Communication Skills Desire for Improvement Financial Resources/Insurance Physical Health Social Support  ADL's:  Intact  Cognition:  WNL  Sleep:  Number of Hours: 4.25   Problems Addressed: MDD severe  Treatment Plan Summary: Daily contact with patient to assess and evaluate symptoms and progress in treatment, Medication management and Plan is to:  -Start Gabapentin 300 mg PO TID for agitation -Continue Vistaril 25 mg PO TID PRN for anxiety -Continue Remeron 15 mg PO  QHS for mood stability -Continue Paxil 20 mg PO Daily for mood stability -Encourage group therapy participation  Maryfrances Bunnell, FNP 09/27/2017, 1:52 PM

## 2017-09-27 NOTE — Plan of Care (Signed)
  Progressing Health Behavior/Discharge Planning: Compliance with treatment plan for underlying cause of condition will improve 09/27/2017 1403 - Progressing by Layla BarterWhite, Javonna Balli L, RN Safety: Periods of time without injury will increase 09/27/2017 1403 - Progressing by Layla BarterWhite, Rhealyn Cullen L, RN Medication: Compliance with prescribed medication regimen will improve 09/27/2017 1403 - Progressing by Layla BarterWhite, Sumiya Mamaril L, RN

## 2017-09-27 NOTE — [facility] Group Notes (Signed)
[facility] LCSW Group Therapy Note  Date/Time: 09/27/17, 1315  Type of Therapy/Topic:  Group Therapy:  Balance in Life  Participation Level: Did not attend   Description of Group:    This group will address the concept of balance and how it feels and looks when one is unbalanced. Patients will be encouraged to process areas in their lives that are out of balance, and identify reasons for remaining unbalanced. Facilitators will guide patients utilizing problem- solving interventions to address and correct the stressor making their life unbalanced. Understanding and applying boundaries will be explored and addressed for obtaining  and maintaining a balanced life. Patients will be encouraged to explore ways to assertively make their unbalanced needs known to significant others in their lives, using other group members and facilitator for support and feedback.  Therapeutic Goals: 1. Patient will identify two or more emotions or situations they have that consume much of in their lives. 2. Patient will identify signs/triggers that life has become out of balance:  3. Patient will identify two ways to set boundaries in order to achieve balance in their lives:  4. Patient will demonstrate ability to communicate their needs through discussion and/or role plays  Summary of Patient Progress:          Therapeutic Modalities:   Cognitive Behavioral Therapy Solution-Focused Therapy Assertiveness Training  Daleen SquibbGreg Talon Witting, LCSW

## 2017-09-27 NOTE — Progress Notes (Signed)
Nursing Progress Note: 7p-7a D: Pt currently presents with a pacing/anxious/sad/worried affect and behavior. Pt states "I just can't stop worrying about everything. I am so nervous, and I can't even tell you why." Interacting minimally with the milieu. Pt reports good sleep during the previous night with current medication regimen. Pt did attend wrap-up group.  A: Pt provided with medications per providers orders. Pt's labs and vitals were monitored throughout the night. Pt supported emotionally and encouraged to express concerns and questions. Pt educated on medications.  R: Pt's safety ensured with 15 minute and environmental checks. Pt currently denies SI, HI, and AVH. Pt verbally contracts to seek staff if SI,HI, or AVH occurs and to consult with staff before acting on any harmful thoughts. Will continue to monitor.

## 2017-09-27 NOTE — Progress Notes (Signed)
Pt presents with a flat affect and anxious mood. Pt endorses depression and anxiety. Pt denies SI/HI. Pt c/o difficulty sleeping last night due to restless leg. Pt appears to be increasingly anxious, fidgety and pacing. Pt appeared to be guarded and forwarded little information during shift assessment. No side effects to Paxil verbalized by pt. Medications reviewed with pt. Medications administered as ordered per MD. Verbal support provided. Pt encouraged to attend groups. 15 minute checks performed for safety.

## 2017-09-27 NOTE — Progress Notes (Signed)
Adult Psychoeducational Group Note  Date:  09/27/2017 Time:1600  Participation Level:  Did Not Attend   [name] 09/27/2017, 4:49 PM  

## 2017-09-28 MED ORDER — GABAPENTIN 300 MG PO CAPS
600.0000 mg | ORAL_CAPSULE | Freq: Three times a day (TID) | ORAL | Status: DC
  Administered 2017-09-28: 600 mg via ORAL
  Filled 2017-09-28 (×5): qty 2

## 2017-09-28 MED ORDER — GABAPENTIN 400 MG PO CAPS
400.0000 mg | ORAL_CAPSULE | Freq: Three times a day (TID) | ORAL | Status: DC
  Administered 2017-09-28 – 2017-09-30 (×7): 400 mg via ORAL
  Filled 2017-09-28 (×12): qty 1

## 2017-09-28 NOTE — [facility] Group Notes (Signed)
  [facility] LCSW Group Therapy Note  Date/Time: 09/28/17, 1315  Type of Therapy/Topic:  Group Therapy:  Emotion Regulation  Participation Level:  None   Mood: withdrawn  Description of Group:    The purpose of this group is to assist patients in learning to regulate negative emotions and experience positive emotions. Patients will be guided to discuss ways in which they have been vulnerable to their negative emotions. These vulnerabilities will be juxtaposed with experiences of positive emotions or situations, and patients challenged to use positive emotions to combat negative ones. Special emphasis will be placed on coping with negative emotions in conflict situations, and patients will process healthy conflict resolution skills.  Therapeutic Goals: 1. Patient will identify two positive emotions or experiences to reflect on in order to balance out negative emotions:  2. Patient will label two or more emotions that they find the most difficult to experience:  3. Patient will be able to demonstrate positive conflict resolution skills through discussion or role plays:   Summary of Patient Progress: Pt responded to CSW question that fear is difficult emotion for him to control.  Pt was not engaged in group and left about halfway through without making any additional comments.       Therapeutic Modalities:   Cognitive Behavioral Therapy Feelings Identification Dialectical Behavioral Therapy  Daleen SquibbGreg Monte Zinni, LCSW
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
## 2017-09-28 NOTE — [facility] Group Notes (Signed)
[facility] Group Notes:  (Nursing/MHT/Case Management/Adjunct)  Date:  09/28/2017  Time:  5:50 PM  Type of Therapy:  Psychoeducational Skills  Participation Level:  Active  Participation Quality:  Appropriate and Attentive  Affect:  Appropriate  Cognitive:  Alert and Appropriate  Insight:  Appropriate and Good  Engagement in Group:  Engaged  Modes of Intervention:  Discussion  Summary of Progress/Problems: Patient attended group and participated.  Audrie Lializabeth O Tadarrius Burch 09/28/2017, 5:50 PM

## 2017-09-28 NOTE — Progress Notes (Signed)
Recreation Therapy Notes  Date:  09/28/17 Time: 0930 Location: [address] Dayroom  Group Topic: Stress Management  Goal Area(s) Addresses:  Patient will verbalize importance of using healthy stress management.  Patient will identify positive emotions associated with healthy stress management.   Intervention: Stress Managemnt  Activity :  Meditation.  LRT introduced the stress management technique of meditation.  LRT played Brown meditation on the stillness and resilience of mountains and how it can be used in mindfulness to ground and center you during the meditation.  Education:  Stress Management, Discharge Planning.   Education Outcome: Acknowledges edcuation/In group clarification offered/Needs additional education  Clinical Observations/Feedback: Pt did not attend group.     Allen RancherMarjette Desmond Brown, LRT/CTRS         Allen RancherLindsay, Allen Brown 09/28/2017 11:24 AM

## 2017-09-28 NOTE — Progress Notes (Signed)
Nursing Progress Note: 7p-7a D: Pt currently presents with a anxious/pacing/med seeking affect and behavior. Pt states "I am feeling less anxious than yesterday." Interacting appropriately with the milieu. Pt reports good sleep during the previous night with current medication regimen. Pt did attend wrap-up group.  A: Pt provided with medications per providers orders. Pt's labs and vitals were monitored throughout the night. Pt supported emotionally and encouraged to express concerns and questions. Pt educated on medications.  R: Pt's safety ensured with 15 minute and environmental checks. Pt currently denies SI, HI, and AVH. Pt verbally contracts to seek staff if SI,HI, or AVH occurs and to consult with staff before acting on any harmful thoughts. Will continue to monitor.

## 2017-09-28 NOTE — Progress Notes (Signed)
[facility]  09/28/2017 1:34 PM Isla PenceJeremy Atwater  MRN:  [id number]   Subjective:  Patient states that he did not sleep well and that it is due to restless leg syndrome. He is requesting Requip. Patient reports mild depression and no anxiety. He denies nay SI/HI/AVH and contracts for safety.  Objective: Patient's chart and findings reviewed and discussed with treatment team. Patient presents in the hallway and has been cooperative and pleasant. Decided to increase Gabapentin to 400 mg TID to assist with agitation, sleep, and possible withdrawal symptoms.   Principal Problem: MDD (major depressive disorder), recurrent severe, without psychosis (HCC) Diagnosis:   Patient Active Problem List   Diagnosis Date Noted  . MDD (major depressive disorder) [F32.9] 09/26/2017  . Substance use disorder [F19.90] 05/10/2017  . MDD (major depressive disorder), recurrent severe, without psychosis (HCC) [F33.2] 05/09/2017   Total Time spent with patient: 25 minutes  Past Psychiatric History: See H&P  Past Medical History:  Past Medical History:  Diagnosis Date  . GERD (gastroesophageal reflux disease)   . Hypertension   . PTSD (post-traumatic stress disorder)     Past Surgical History:  Procedure Laterality Date  . ELBOW SURGERY     Family History: History reviewed. No pertinent family history. Family Psychiatric  History: See H&P Social History:  Social History   Substance and Sexual Activity  Alcohol Use Yes   Comment: 1 40 oz in the last month      Social History   Substance and Sexual Activity  Drug Use Yes  . Types: Marijuana    Social History   Socioeconomic History  . Marital status: Legally Separated    Spouse name: None  . Number of children: None  . Years of education: None  . Highest education level: None  Social Needs  . Financial resource strain: None  . Food insecurity - worry: None  . Food insecurity - inability: None  . Transportation needs - medical: None   . Transportation needs - non-medical: None  Occupational History  . None  Tobacco Use  . Smoking status: Current Every Day Smoker    Packs/day: 1.00  . Smokeless tobacco: Never Used  Substance and Sexual Activity  . Alcohol use: Yes    Comment: 1 40 oz in the last month   . Drug use: Yes    Types: Marijuana  . Sexual activity: Yes    Birth control/protection: None  Other Topics Concern  . None  Social History Narrative  . None   Additional Social History:    Pain Medications: See MAR Prescriptions: See MAR Over the Counter: See MAR Name of Substance 1: Alcohol 1 - Last Use / Amount: BAC on admission was .21                  Sleep: Good  Appetite:  Good  Current Medications: Current Facility-Administered Medications  Medication Dose Route Frequency Provider Last Rate Last Dose  . acetaminophen (TYLENOL) tablet 650 mg  650 mg Oral Q6H PRN Okonkwo, Justina A, NP   650 mg at 09/28/17 1027  . alum & mag hydroxide-simeth (MAALOX/MYLANTA) 200-200-20 MG/5ML suspension 30 mL  30 mL Oral Q4H PRN Okonkwo, Justina A, NP      . feeding supplement (ENSURE ENLIVE) (ENSURE ENLIVE) liquid 237 mL  237 mL Oral BID BM Izediuno, Vincent A, MD   237 mL at 09/27/17 1512  . gabapentin (NEURONTIN) capsule 600 mg  600 mg Oral TID Clothilde Tippetts, Gerlene Burdockravis B, FNP  600 mg at 09/28/17 1151  . hydrOXYzine (ATARAX/VISTARIL) tablet 25 mg  25 mg Oral TID PRN Ferne Reus A, NP   25 mg at 09/28/17 1027  . magnesium hydroxide (MILK OF MAGNESIA) suspension 30 mL  30 mL Oral Daily PRN Okonkwo, Justina A, NP      . mirtazapine (REMERON) tablet 15 mg  15 mg Oral QHS Izediuno, Delight Ovens, MD   15 mg at 09/27/17 2216  . mirtazapine (REMERON) tablet 15 mg  15 mg Oral Once Donell Sievert E, PA-C      . neomycin-bacitracin-polymyxin (NEOSPORIN) ointment   Topical PRN Armandina Stammer I, NP      . nicotine (NICODERM CQ - dosed in mg/24 hours) patch 21 mg  21 mg Transdermal Daily Izediuno, Delight Ovens, MD   21 mg at  09/28/17 1029  . PARoxetine (PAXIL) tablet 20 mg  20 mg Oral Daily Starkes, Takia S, FNP   20 mg at 09/28/17 1027    Lab Results:  Results for orders placed or performed during the hospital encounter of 09/26/17 (from the past 48 hour(s))  TSH     Status: None   Collection Time: 09/27/17  6:33 AM  Result Value Ref Range   TSH 0.724 0.350 - 4.500 uIU/mL    Comment: Performed by a 3rd Generation assay with a functional sensitivity of <=0.01 uIU/mL. Performed at [facility], [address] [place], [place] [address]   Hemoglobin A1c     Status: None   Collection Time: 09/27/17  6:33 AM  Result Value Ref Range   Hgb A1c MFr Bld 5.3 4.8 - 5.6 %    Comment: (NOTE) Pre diabetes:          5.7%-6.4% Diabetes:              >6.4% Glycemic control for   <7.0% adults with diabetes    Mean Plasma Glucose 105.41 mg/dL    Comment: Performed at [facility], [address], [place], [place] [address]  Lipid panel     Status: Abnormal   Collection Time: 09/27/17  6:33 AM  Result Value Ref Range   Cholesterol 156 0 - 200 mg/dL   Triglycerides 86 <562 mg/dL   HDL 37 (L) >13 mg/dL   Total CHOL/HDL Ratio 4.2 RATIO   VLDL 17 0 - 40 mg/dL   LDL Cholesterol 086 (H) 0 - 99 mg/dL    Comment:        Total Cholesterol/HDL:CHD Risk Coronary Heart Disease Risk Table                     Men   Women  1/2 Average Risk   3.4   3.3  Average Risk       5.0   4.4  2 X Average Risk   9.6   7.1  3 X Average Risk  23.4   11.0        Use the calculated Patient Ratio above and the CHD Risk Table to determine the patient's CHD Risk.        ATP III CLASSIFICATION (LDL):  <100     mg/dL   Optimal  578-469  mg/dL   Near or Above                    Optimal  130-159  mg/dL   Borderline  629-528  mg/dL   High  >413     mg/dL  Very High Performed at [facility], [address], [place], [place] [address]     Blood Alcohol level:  Lab Results  Component Value  Date   ETH 223 (H) 11/30/2011    Metabolic Disorder Labs: Lab Results  Component Value Date   HGBA1C 5.3 09/27/2017   MPG 105.41 09/27/2017   No results found for: PROLACTIN Lab Results  Component Value Date   CHOL 156 09/27/2017   TRIG 86 09/27/2017   HDL 37 (L) 09/27/2017   CHOLHDL 4.2 09/27/2017   VLDL 17 09/27/2017   LDLCALC 102 (H) 09/27/2017    Physical Findings: AIMS: Facial and Oral Movements Muscles of Facial Expression: None, normal Lips and Perioral Area: None, normal Jaw: None, normal Tongue: None, normal,Extremity Movements Upper (arms, wrists, hands, fingers): None, normal Lower (legs, knees, ankles, toes): None, normal, Trunk Movements Neck, shoulders, hips: None, normal, Overall Severity Severity of abnormal movements (highest score from questions above): None, normal Incapacitation due to abnormal movements: None, normal Patient's awareness of abnormal movements (rate only patient's report): No Awareness, Dental Status Current problems with teeth and/or dentures?: No Does patient usually wear dentures?: No  CIWA:    COWS:     Musculoskeletal: Strength & Muscle Tone: within normal limits Gait & Station: normal Patient leans: N/A  Psychiatric Specialty Exam: Physical Exam  Vitals reviewed. Constitutional: He is oriented to person, place, and time. He appears well-developed and well-nourished.  Respiratory: Effort normal.  Musculoskeletal: Normal range of motion.  Neurological: He is oriented to person, place, and time.  Skin: Skin is warm.    Review of Systems  Constitutional: Negative.   HENT: Negative.   Eyes: Negative.   Respiratory: Negative.   Cardiovascular: Negative.   Gastrointestinal: Negative.   Genitourinary: Negative.   Musculoskeletal: Negative.   Skin:       Multiple lacerations on upper extremities  Neurological: Negative.   Psychiatric/Behavioral: Positive for depression. Negative for hallucinations and suicidal ideas. The  patient is nervous/anxious.     Blood pressure 112/75, pulse (!) 106, temperature 97.9 F (36.6 C), temperature source Oral, resp. rate 20, height 6' 0.5" (1.842 m), weight 76.3 kg (168 lb 4 oz).Body mass index is 22.51 kg/m.  General Appearance: Casual  Eye Contact:  Good  Speech:  Clear and Coherent and Normal Rate  Volume:  Normal  Mood:  Euthymic  Affect:  Congruent  Thought Process:  Goal Directed and Descriptions of Associations: Intact  Orientation:  Full (Time, Place, and Person)  Thought Content:  WDL  Suicidal Thoughts:  No  Homicidal Thoughts:  No  Memory:  Immediate;   Good Recent;   Good Remote;   Good  Judgement:  Good  Insight:  Good  Psychomotor Activity:  Normal  Concentration:  Concentration: Good and Attention Span: Good  Recall:  Good  Fund of Knowledge:  Good  Language:  Good  Akathisia:  No  Handed:  Right  AIMS (if indicated):     Assets:  Communication Skills Desire for Improvement Financial Resources/Insurance Physical Health Social Support  ADL's:  Intact  Cognition:  WNL  Sleep:  Number of Hours: 3.75   Problems Addressed: MDD severe  Treatment Plan Summary: Daily contact with patient to assess and evaluate symptoms and progress in treatment, Medication management and Plan is to:  -Increase Gabapentin 400 mg PO TID for agitation/withdrawal -Continue Vistaril 25 mg PO TID PRN for anxiety -Continue Remeron 15 mg PO QHS for mood stability -Continue Paxil 20 mg  PO Daily for mood stability -Encourage group therapy participation  Maryfrances Bunnell, FNP 09/28/2017, 1:34 PM

## 2017-09-28 NOTE — Tx Team (Signed)
Interdisciplinary Treatment and Diagnostic Plan Update  09/28/2017 Time of Session: 1019 Allen Brown MRN: [id number]  Principal Diagnosis: MDD (major depressive disorder), recurrent severe, without psychosis (HCC)  Secondary Diagnoses: Principal Problem:   MDD (major depressive disorder), recurrent severe, without psychosis (HCC) Active Problems:   Substance use disorder   MDD (major depressive disorder)   Current Medications:  Current Facility-Administered Medications  Medication Dose Route Frequency Provider Last Rate Last Dose  . acetaminophen (TYLENOL) tablet 650 mg  650 mg Oral Q6H PRN Okonkwo, Justina A, NP   650 mg at 09/28/17 1027  . alum & mag hydroxide-simeth (MAALOX/MYLANTA) 200-200-20 MG/5ML suspension 30 mL  30 mL Oral Q4H PRN Okonkwo, Justina A, NP      . feeding supplement (ENSURE ENLIVE) (ENSURE ENLIVE) liquid 237 mL  237 mL Oral BID BM Izediuno, Vincent A, MD   237 mL at 09/27/17 1512  . gabapentin (NEURONTIN) capsule 600 mg  600 mg Oral TID Money, Gerlene Burdock, FNP   600 mg at 09/28/17 1151  . hydrOXYzine (ATARAX/VISTARIL) tablet 25 mg  25 mg Oral TID PRN Ferne Reus A, NP   25 mg at 09/28/17 1027  . magnesium hydroxide (MILK OF MAGNESIA) suspension 30 mL  30 mL Oral Daily PRN Okonkwo, Justina A, NP      . mirtazapine (REMERON) tablet 15 mg  15 mg Oral QHS Izediuno, Delight Ovens, MD   15 mg at 09/27/17 2216  . mirtazapine (REMERON) tablet 15 mg  15 mg Oral Once Donell Sievert E, PA-C      . neomycin-bacitracin-polymyxin (NEOSPORIN) ointment   Topical PRN Armandina Stammer I, NP      . nicotine (NICODERM CQ - dosed in mg/24 hours) patch 21 mg  21 mg Transdermal Daily Izediuno, Delight Ovens, MD   21 mg at 09/28/17 1029  . PARoxetine (PAXIL) tablet 20 mg  20 mg Oral Daily Starkes, Takia S, FNP   20 mg at 09/28/17 1027   PTA Medications: Medications Prior to Admission  Medication Sig Dispense Refill Last Dose  . atenolol (TENORMIN) 100 MG tablet Take 1 tablet (100 mg total) by  mouth daily. (Patient not taking: Reported on 09/26/2017) 30 tablet 0 Not Taking at Unknown time  . gabapentin (NEURONTIN) 300 MG capsule Take 1 capsule (300 mg total) by mouth 3 (three) times daily. (Patient not taking: Reported on 09/26/2017) 90 capsule 0 Not Taking at Unknown time  . hydrOXYzine (ATARAX/VISTARIL) 25 MG tablet Take 1 tablet (25 mg total) by mouth 3 (three) times daily as needed for anxiety. (Patient not taking: Reported on 09/26/2017) 30 tablet 0 Not Taking at Unknown time  . Multiple Vitamin (MULTIVITAMIN WITH MINERALS) TABS tablet Take 1 tablet by mouth daily. (Patient not taking: Reported on 09/26/2017) 30 tablet 0 Not Taking at Unknown time  . nicotine (NICODERM CQ - DOSED IN MG/24 HOURS) 21 mg/24hr patch Place 1 patch (21 mg total) onto the skin daily. (Patient not taking: Reported on 09/26/2017) 28 patch 0 Not Taking at Unknown time  . pantoprazole (PROTONIX) 40 MG tablet Take 1 tablet (40 mg total) by mouth daily. (Patient not taking: Reported on 09/26/2017) 30 tablet 0 Not Taking at Unknown time  . PARoxetine (PAXIL) 40 MG tablet Take 1 tablet (40 mg total) by mouth daily. (Patient not taking: Reported on 09/26/2017) 30 tablet 0 Not Taking at Unknown time  . traZODone (DESYREL) 100 MG tablet Take 1 tablet (100 mg total) by mouth at bedtime as needed and may repeat  dose one time if needed for sleep. (Patient not taking: Reported on 09/26/2017) 30 tablet 0 Not Taking at Unknown time    Patient Stressors: Marital or family conflict Medication change or noncompliance Occupational concerns  Patient Strengths: Wellsite geologistCommunication skills General fund of knowledge  Treatment Modalities: Medication Management, Group therapy, Case management,  1 to 1 session with clinician, Psychoeducation, Recreational therapy.   Physician Treatment Plan for Primary Diagnosis: MDD (major depressive disorder), recurrent severe, without psychosis (HCC) Long Term Goal(s): Improvement in symptoms so as ready  for discharge Improvement in symptoms so as ready for discharge   Short Term Goals: Ability to identify changes in lifestyle to reduce recurrence of condition will improve Ability to verbalize feelings will improve Ability to disclose and discuss suicidal ideas Ability to demonstrate self-control will improve Ability to identify and develop effective coping behaviors will improve Ability to maintain clinical measurements within normal limits will improve Compliance with prescribed medications will improve Ability to identify triggers associated with substance abuse/mental health issues will improve Ability to identify changes in lifestyle to reduce recurrence of condition will improve Ability to verbalize feelings will improve Ability to disclose and discuss suicidal ideas Ability to demonstrate self-control will improve Ability to identify and develop effective coping behaviors will improve Ability to maintain clinical measurements within normal limits will improve Compliance with prescribed medications will improve Ability to identify triggers associated with substance abuse/mental health issues will improve  Medication Management: Evaluate patient's response, side effects, and tolerance of medication regimen.  Therapeutic Interventions: 1 to 1 sessions, Unit Group sessions and Medication administration.  Evaluation of Outcomes: Progressing  Physician Treatment Plan for Secondary Diagnosis: Principal Problem:   MDD (major depressive disorder), recurrent severe, without psychosis (HCC) Active Problems:   Substance use disorder   MDD (major depressive disorder)  Long Term Goal(s): Improvement in symptoms so as ready for discharge Improvement in symptoms so as ready for discharge   Short Term Goals: Ability to identify changes in lifestyle to reduce recurrence of condition will improve Ability to verbalize feelings will improve Ability to disclose and discuss suicidal  ideas Ability to demonstrate self-control will improve Ability to identify and develop effective coping behaviors will improve Ability to maintain clinical measurements within normal limits will improve Compliance with prescribed medications will improve Ability to identify triggers associated with substance abuse/mental health issues will improve Ability to identify changes in lifestyle to reduce recurrence of condition will improve Ability to verbalize feelings will improve Ability to disclose and discuss suicidal ideas Ability to demonstrate self-control will improve Ability to identify and develop effective coping behaviors will improve Ability to maintain clinical measurements within normal limits will improve Compliance with prescribed medications will improve Ability to identify triggers associated with substance abuse/mental health issues will improve     Medication Management: Evaluate patient's response, side effects, and tolerance of medication regimen.  Therapeutic Interventions: 1 to 1 sessions, Unit Group sessions and Medication administration.  Evaluation of Outcomes: Progressing   RN Treatment Plan for Primary Diagnosis: MDD (major depressive disorder), recurrent severe, without psychosis (HCC) Long Term Goal(s): Knowledge of disease and therapeutic regimen to maintain health will improve  Short Term Goals: Ability to identify and develop effective coping behaviors will improve and Compliance with prescribed medications will improve  Medication Management: RN will administer medications as ordered by provider, will assess and evaluate patient's response and provide education to patient for prescribed medication. RN will report any adverse and/or side effects to prescribing provider.  Therapeutic  Interventions: 1 on 1 counseling sessions, Psychoeducation, Medication administration, Evaluate responses to treatment, Monitor vital signs and CBGs as ordered, Perform/monitor  CIWA, COWS, AIMS and Fall Risk screenings as ordered, Perform wound care treatments as ordered.  Evaluation of Outcomes: Progressing   LCSW Treatment Plan for Primary Diagnosis: MDD (major depressive disorder), recurrent severe, without psychosis (HCC) Long Term Goal(s): Safe transition to appropriate next level of care at discharge, Engage patient in therapeutic group addressing interpersonal concerns.  Short Term Goals: Engage patient in aftercare planning with referrals and resources, Increase social support and Increase skills for wellness and recovery  Therapeutic Interventions: Assess for all discharge needs, 1 to 1 time with Social worker, Explore available resources and support systems, Assess for adequacy in community support network, Educate family and significant other(s) on suicide prevention, Complete Psychosocial Assessment, Interpersonal group therapy.  Evaluation of Outcomes: Progressing   Progress in Treatment: Attending groups: Yes. Participating in groups: No. Taking medication as prescribed: Yes. Toleration medication: Yes. Family/Significant other contact made: Yes, individual(s) contacted:  daughter Patient understands diagnosis: Yes. Discussing patient identified problems/goals with staff: Yes. Medical problems stabilized or resolved: Yes. Denies suicidal/homicidal ideation: Yes. Issues/concerns per patient self-inventory: No. Other: none  New problem(s) identified: No, Describe:  none  New Short Term/Long Term Goal(s):Pt goal "go home, get back to work"  Discharge Plan or Barriers: [facility]  Reason for Continuation of Hospitalization: Depression Medication stabilization  Estimated Length of Stay: 1-2 days  Attendees: Patient:Allen Brown 09/28/2017   Physician: Dr Jackquline Berlin, MD 09/28/2017   Nursing: Roddie Mc, RN 09/28/2017   RN Care Manager: 09/28/2017   Social Worker: Daleen Squibb, LCSW 09/28/2017   Recreational Therapist:  09/28/2017    Other:  09/28/2017   Other:  09/28/2017   Other: 09/28/2017          Scribe for Treatment Team: Lorri Frederick, LCSW 09/28/2017 2:49 PM

## 2017-09-29 DIAGNOSIS — G4700 Insomnia, unspecified: Secondary | ICD-10-CM

## 2017-09-29 DIAGNOSIS — Z590 Homelessness: Secondary | ICD-10-CM

## 2017-09-29 NOTE — Progress Notes (Signed)
Adult Psychoeducational Group Note  Date:  09/29/2017 Time:  11:11 PM  Group Topic/Focus:  Wrap-Up Group:   The focus of this group is to help patients review their daily goal of treatment and discuss progress on daily workbooks.  Participation Level:  Active  Participation Quality:  Appropriate  Affect:  Appropriate  Cognitive:  Appropriate  Insight: Appropriate  Engagement in Group:  Engaged  Modes of Intervention:  Discussion  Additional Comments:  Patient attended group and participated.   Mell Mellott W Kieon Lawhorn 09/29/2017, 11:11 PM

## 2017-09-29 NOTE — Progress Notes (Signed)
D: Pt visible in milieu for long intervals during shift. Observedd interacting well with peers and staff. Denies SI "not this minute", HI and AVH. Rates his depression 3/10, hopelessness 3/10 and anxiety 4/10. Pt reports poor sleep last night "just problem with staying asleep", good appetite, normal energy and good concentration level on self inventory sheet.  A: Scheduled and PRN (Vistaril & Tylenol) medications given as ordered with verbal education and effects monitored. Safety checks maintained at Q 15 minutes intervals without self harm gestures. Emotional support and encouragement provided to pt throughout this shift.  R: pt receptive to care. Remains compliant with medications. Denies adverse drug reactions when assessed. Attended and participated in groups. POC remains effective for safety and mood stability.

## 2017-09-29 NOTE — Progress Notes (Signed)
[facility] MD Progress Note  09/29/2017 5:03 PM Allen Brown  MRN:  [id number]   Subjective: Allen Brown reports, "The reason I came to the hospital is very personal, Ma'am. My daughter committed me, that is all I've got to say. I'm not going to group sessions because I need to sleep".  Objective: 4I y.o Caucasian male, homeless, recently separated from long term girlfriend a few days ago. Transferred from [facility] ER. Presented voluntarily in company of his daughter. Was Involuntarily committed there as her family had concerns about his personal safety. He is reported to have been actively searching for a gun. Today, 09-29-16, Allen Brown is seen, Chart reviewed. The chart findings discussed with the treatment team. He is lying down in his bed. He presents alert, oriented x 4. He is making good eye contact. He is verbally responsive & yet guarded. He says the reason for his admission to the hospital is personal. Says he is not willing to discuss it. He adds that his daughter involuntarily committed him to the hospital. He says he is not attending group sessions because he needed to sleep. He says he is not going answer any more questions. He presents a bit uncooperative. No disruptive behavior reported by the staff. He does not appear to be responding to any internal stimuli.   Principal Problem: MDD (major depressive disorder), recurrent severe, without psychosis (HCC)  Diagnosis:   Patient Active Problem List   Diagnosis Date Noted  . MDD (major depressive disorder) [F32.9] 09/26/2017  . Substance use disorder [F19.90] 05/10/2017  . MDD (major depressive disorder), recurrent severe, without psychosis (HCC) [F33.2] 05/09/2017   Total Time spent with patient: 15 minutes  Past Psychiatric History: See H&P  Past Medical History:  Past Medical History:  Diagnosis Date  . GERD (gastroesophageal reflux disease)   . Hypertension   . PTSD (post-traumatic stress disorder)     Past Surgical History:  Procedure  Laterality Date  . ELBOW SURGERY     Family History: History reviewed. No pertinent family history.  Family Psychiatric  History: See H&P  Social History:  Social History   Substance and Sexual Activity  Alcohol Use Yes   Comment: 1 40 oz in the last month      Social History   Substance and Sexual Activity  Drug Use Yes  . Types: Marijuana    Social History   Socioeconomic History  . Marital status: Legally Separated    Spouse name: None  . Number of children: None  . Years of education: None  . Highest education level: None  Social Needs  . Financial resource strain: None  . Food insecurity - worry: None  . Food insecurity - inability: None  . Transportation needs - medical: None  . Transportation needs - non-medical: None  Occupational History  . None  Tobacco Use  . Smoking status: Current Every Day Smoker    Packs/day: 1.00  . Smokeless tobacco: Never Used  Substance and Sexual Activity  . Alcohol use: Yes    Comment: 1 40 oz in the last month   . Drug use: Yes    Types: Marijuana  . Sexual activity: Yes    Birth control/protection: None  Other Topics Concern  . None  Social History Narrative  . None   Additional Social History:  Pain Medications: See MAR Prescriptions: See MAR Over the Counter: See MAR Name of Substance 1: Alcohol 1 - Last Use / Amount: BAC on admission was .21  Sleep: Good  Appetite:  Good  Current Medications: Current Facility-Administered Medications  Medication Dose Route Frequency Provider Last Rate Last Dose  . acetaminophen (TYLENOL) tablet 650 mg  650 mg Oral Q6H PRN Okonkwo, Justina A, NP   650 mg at 09/29/17 1149  . alum & mag hydroxide-simeth (MAALOX/MYLANTA) 200-200-20 MG/5ML suspension 30 mL  30 mL Oral Q4H PRN Okonkwo, Justina A, NP      . feeding supplement (ENSURE ENLIVE) (ENSURE ENLIVE) liquid 237 mL  237 mL Oral BID BM Izediuno, Vincent A, MD   237 mL at 09/29/17 1100  . gabapentin (NEURONTIN) capsule 400 mg   400 mg Oral TID Money, Gerlene Burdockravis B, FNP   400 mg at 09/29/17 1149  . hydrOXYzine (ATARAX/VISTARIL) tablet 25 mg  25 mg Oral TID PRN Ferne Reuskonkwo, Justina A, NP   25 mg at 09/29/17 1302  . magnesium hydroxide (MILK OF MAGNESIA) suspension 30 mL  30 mL Oral Daily PRN Okonkwo, Justina A, NP      . mirtazapine (REMERON) tablet 15 mg  15 mg Oral QHS Izediuno, Delight OvensVincent A, MD   15 mg at 09/28/17 2155  . mirtazapine (REMERON) tablet 15 mg  15 mg Oral Once Donell SievertSimon, Spencer E, PA-C      . neomycin-bacitracin-polymyxin (NEOSPORIN) ointment   Topical PRN Armandina StammerNwoko, Agnes I, NP      . nicotine (NICODERM CQ - dosed in mg/24 hours) patch 21 mg  21 mg Transdermal Daily Izediuno, Delight OvensVincent A, MD   21 mg at 09/29/17 0816  . PARoxetine (PAXIL) tablet 20 mg  20 mg Oral Daily Truman HaywardStarkes, Takia S, FNP   20 mg at 09/29/17 [id number]   Lab Results:  No results found for this or any previous visit (from the past 48 hour(s)).  Blood Alcohol level:  Lab Results  Component Value Date   ETH 223 (H) 11/30/2011   Metabolic Disorder Labs: Lab Results  Component Value Date   HGBA1C 5.3 09/27/2017   MPG 105.41 09/27/2017   No results found for: PROLACTIN Lab Results  Component Value Date   CHOL 156 09/27/2017   TRIG 86 09/27/2017   HDL 37 (L) 09/27/2017   CHOLHDL 4.2 09/27/2017   VLDL 17 09/27/2017   LDLCALC 102 (H) 09/27/2017   Physical Findings: AIMS: Facial and Oral Movements Muscles of Facial Expression: None, normal Lips and Perioral Area: None, normal Jaw: None, normal Tongue: None, normal,Extremity Movements Upper (arms, wrists, hands, fingers): None, normal Lower (legs, knees, ankles, toes): None, normal, Trunk Movements Neck, shoulders, hips: None, normal, Overall Severity Severity of abnormal movements (highest score from questions above): None, normal Incapacitation due to abnormal movements: None, normal Patient's awareness of abnormal movements (rate only patient's report): No Awareness, Dental Status Current  problems with teeth and/or dentures?: No Does patient usually wear dentures?: No  CIWA:    COWS:     Musculoskeletal: Strength & Muscle Tone: within normal limits Gait & Station: normal Patient leans: N/A  Psychiatric Specialty Exam: Physical Exam  Vitals reviewed. Constitutional: He is oriented to person, place, and time. He appears well-developed and well-nourished.  Respiratory: Effort normal.  Musculoskeletal: Normal range of motion.  Neurological: He is oriented to person, place, and time.  Skin: Skin is warm.    Review of Systems  Constitutional: Negative.   HENT: Negative.   Eyes: Negative.   Respiratory: Negative.   Cardiovascular: Negative.   Gastrointestinal: Negative.   Genitourinary: Negative.   Musculoskeletal: Negative.   Skin:       Multiple lacerations  on upper extremities  Neurological: Negative.   Psychiatric/Behavioral: Positive for depression. Negative for hallucinations and suicidal ideas. The patient is nervous/anxious.     Blood pressure 118/82, pulse (!) 120, temperature 98 F (36.7 C), temperature source Oral, resp. rate 18, height 6' 0.5" (1.842 m), weight 76.3 kg (168 lb 4 oz).Body mass index is 22.51 kg/m.  General Appearance: Casual  Eye Contact:  Good  Speech:  Clear and Coherent and Normal Rate  Volume:  Normal  Mood:  Euthymic  Affect:  Congruent  Thought Process:  Goal Directed and Descriptions of Associations: Intact  Orientation:  Full (Time, Place, and Person)  Thought Content:  WDL  Suicidal Thoughts:  No  Homicidal Thoughts:  No  Memory:  Immediate;   Good Recent;   Good Remote;   Good  Judgement:  Good  Insight:  Good  Psychomotor Activity:  Normal  Concentration:  Concentration: Good and Attention Span: Good  Recall:  Good  Fund of Knowledge:  Good  Language:  Good  Akathisia:  No  Handed:  Right  AIMS (if indicated):     Assets:  Communication Skills Desire for Improvement Financial Resources/Insurance Physical  Health Social Support  ADL's:  Intact  Cognition:  WNL  Sleep:  Number of Hours: 6.75   Problems Addressed: MDD severe  Treatment Plan Summary: Daily contact with patient to assess and evaluate symptoms and progress in treatment, Medication management and Plan is to: patient presents very guarded during this follow-up care assessment. He says the reasons for being this hospital is personal.  Agitation.   -  Continue Gabapentin 400 mg PO TID for agitation.  Anxiety.    - Continue Vistaril 25 mg PO TID PRN.  Depression/insomnia.    - Continue Remeron 15 mg PO Q HS.    - Continue Paxil 20 mg po daily.  Nicotine withdrawal.    - Continue Nicotine patch 21 mg transdermally Q 24 hrs.     - Encourage group therapy participation    - Discharge plan ongoing.  Armandina Stammer, NP, PMHNP, FNP-BC. 09/29/2017, 5:03 PMPatient ID: Allen Brown, male   DOB: [birth year], 42 y.o.   MRN: [id number]

## 2017-09-29 NOTE — Progress Notes (Signed)
Patient ID: Allen Brown, male   DOB: 11/13/1975, 42 y.o.   MRN: [id number]  Pt currently presents with a masked affect and anxious, hyperactive behavior. Pt reports to writer that their goal is to "go home hopefully be Sunday." Pt states "we were trying to get my stepson to come in here, he has bipolar too and needs get some help." Pt reports good sleep with current medication regimen.   Pt provided with medications per providers orders. Pt's labs and vitals were monitored throughout the night. Pt given a 1:1 about emotional and mental status. Pt supported and encouraged to express concerns and questions. Pt educated on medications.  Pt's safety ensured with 15 minute and environmental checks. Pt currently denies SI/HI and A/V hallucinations. Pt verbally agrees to seek staff if SI/HI or A/VH occurs and to consult with staff before acting on any harmful thoughts. Will continue POC.

## 2017-09-29 NOTE — [facility] Group Notes (Signed)
[facility] Group Notes: (Clinical Social Work)   09/29/2017      Type of Therapy:  Group Therapy   Participation Level:  Did Not Attend despite MHT prompting   Aemon Koeller Grossman-Orr, LCSW 09/29/2017, 12:19 PM     

## 2017-09-30 MED ORDER — HYDROXYZINE HCL 50 MG PO TABS
50.0000 mg | ORAL_TABLET | Freq: Three times a day (TID) | ORAL | Status: DC
  Administered 2017-09-30 – 2017-10-01 (×3): 50 mg via ORAL
  Filled 2017-09-30 (×2): qty 1
  Filled 2017-09-30 (×4): qty 10
  Filled 2017-09-30 (×6): qty 1
  Filled 2017-09-30: qty 10
  Filled 2017-09-30: qty 1

## 2017-09-30 MED ORDER — HYDROXYZINE HCL 50 MG PO TABS: 50.0000 mg | ORAL_TABLET | Freq: Every day | ORAL | Status: DC

## 2017-09-30 MED ORDER — GABAPENTIN 400 MG PO CAPS
400.0000 mg | ORAL_CAPSULE | Freq: Three times a day (TID) | ORAL | Status: DC
  Administered 2017-09-30 – 2017-10-01 (×3): 400 mg via ORAL
  Filled 2017-09-30 (×4): qty 1
  Filled 2017-09-30 (×3): qty 28
  Filled 2017-09-30: qty 1
  Filled 2017-09-30: qty 28
  Filled 2017-09-30 (×2): qty 1

## 2017-09-30 NOTE — [facility] Group Notes (Signed)
[facility] Group Notes: (Clinical Social Work)   09/30/2017      Type of Therapy:  Group Therapy   Participation Level:  Did Not Attend despite MHT prompting   Tawyna Pellot Grossman-Orr, LCSW 09/30/2017, 12:10 PM     

## 2017-09-30 NOTE — [facility] Group Notes (Signed)
[facility] Group Notes:  (Nursing/MHT/Case Management/Adjunct)  Date:  09/30/2017  Time:  8:05 PM  Type of Therapy:  Nurse Education  Participation Level:  Active  Participation Quality:  Attentive  Affect:  Appropriate  Cognitive:  Alert  Insight:  Improving  Engagement in Group:  Engaged  Modes of Intervention:  Education  Summary of Progress/Problems:The purpose of the groupwas to teach patients ow to set SMART goals. Rich BraveDuke, Shwanda Soltis Lynn 09/30/2017, 8:05 PM

## 2017-09-30 NOTE — Progress Notes (Signed)
D Patient has remained in his bed in his room the majority of the day. He refused to come to his morning groups and waas preoccupied with being discharged today ( although no plan had been made for his discharge today). A He completed his daily assessment and on this this he wrote  He deneid SI today and he rated his depression, hopelessness and anxiety " 3/3/3", respectively. R Safety is in place. C/o'd insomnia still and vistaril increased to 50 QHS and neurontin increased to 400 tid and HS.

## 2017-09-30 NOTE — [facility] Group Notes (Signed)
[facility] Group Notes:  (Nursing/MHT/Case Management/Adjunct)  Date:  09/30/2017  Time:  8:38 PM  Type of Therapy:  Nurse Education  Participation Level:  Active  Participation Quality:  Attentive  Affect:  Appropriate  Cognitive:  Alert  Insight:  Limited  Engagement in Group:  Engaged  Modes of Intervention:  Education  Summary of Progress/Problems: The group focuses on teaching patients how to identify their needs and then how to develop skills needed to get their needs met.  Lauralyn Primes 09/30/2017, 8:38 PM

## 2017-09-30 NOTE — Progress Notes (Signed)
Date: 09/30/2017 Time:  09/30/2017  Group Topic/Focus:  Progressive Relaxation  Psychoeducational Group Note  Date: 09/30/2017 Time:  09/30/2017  Group Topic/Focus: Progressive Relaxation. Purpose of this group is to teach progressive relaxation that the Pt can Use anywhere at anytime. Along with this Deep breathing techniques  Participation Level:  Partisipated  Participation Quality:  Involved  Affect:  appropriate  Cognitive:  appropriate  Insight:  appropriate  Engagement in Group:  Pt engaged in the group  Additional Comments:  Pt attended and participated in all the exercises   Allen Brown A Date: 09/30/2017 Time:  09/30/2017  *  Allen Brown A  

## 2017-09-30 NOTE — Progress Notes (Signed)
[facility]  09/30/2017 5:18 PM Allen PenceJeremy Brown  MRN:  [id number]   Subjective: Allen Brown reports, "I have a lot of problems sleeping. That has been going even when I was at my own. Insomnia, I have had all my life. I got restless leg syndrome. My mood is alright, but the anxiety, I wish will just go down some. I have been here now for few days. I'm missing work. I got mortgage & car payments. I cannot lose my home or my car. My mood is good. My issues are anxiety & sleep problem.  Objective: 42 y.o Caucasian male, homeless, recently separated from long term girlfriend a few days ago. [facility] Presented voluntarily in company of his daughter. Was Involuntarily committed there as her family had concerns about his personal safety. He is reported to have been actively searching for a gun. Today, 09-30-16, Allen Brown is seen, Chart reviewed. The chart findings discussed with the treatment team. He presents alert, oriented x 4. He is making good eye contact. He is verbally responsive. He opened up about his problems today which he centered around anxiety & insomnia. He is also complaining of restless leg syndrome. He attended group sessions today, more visible on the unit today than yesterday.  He presents more cooperative today. No disruptive behavior reported by the staff. He does not appear to be responding to any internal stimuli. His medications has been adjusted to meet his needs.  Principal Problem: MDD (major depressive disorder), recurrent severe, without psychosis (HCC)  Diagnosis:   Patient Active Problem List   Diagnosis Date Noted  . MDD (major depressive disorder) [F32.9] 09/26/2017  . Substance use disorder [F19.90] 05/10/2017  . MDD (major depressive disorder), recurrent severe, without psychosis (HCC) [F33.2] 05/09/2017   Total Time spent with patient: 15 minutes  Past Psychiatric History: See H&P  Past Medical History:  Past Medical History:  Diagnosis Date   . GERD (gastroesophageal reflux disease)   . Hypertension   . PTSD (post-traumatic stress disorder)     Past Surgical History:  Procedure Laterality Date  . ELBOW SURGERY     Family History: History reviewed. No pertinent family history.  Family Psychiatric  History: See H&P  Social History:  Social History   Substance and Sexual Activity  Alcohol Use Yes   Comment: 1 40 oz in the last month      Social History   Substance and Sexual Activity  Drug Use Yes  . Types: Marijuana    Social History   Socioeconomic History  . Marital status: Legally Separated    Spouse name: None  . Number of children: None  . Years of education: None  . Highest education level: None  Social Needs  . Financial resource strain: None  . Food insecurity - worry: None  . Food insecurity - inability: None  . Transportation needs - medical: None  . Transportation needs - non-medical: None  Occupational History  . None  Tobacco Use  . Smoking status: Current Every Day Smoker    Packs/day: 1.00  . Smokeless tobacco: Never Used  Substance and Sexual Activity  . Alcohol use: Yes    Comment: 1 40 oz in the last month   . Drug use: Yes    Types: Marijuana  . Sexual activity: Yes    Birth control/protection: None  Other Topics Concern  . None  Social History Narrative  . None   Additional Social History:  Pain Medications: See MAR Prescriptions:  See MAR Over the Counter: See MAR Name of Substance 1: Alcohol 1 - Last Use / Amount: BAC on admission was .21  Sleep: Good  Appetite:  Good  Current Medications: Current Facility-Administered Medications  Medication Dose Route Frequency Provider Last Rate Last Dose  . acetaminophen (TYLENOL) tablet 650 mg  650 mg Oral Q6H PRN Okonkwo, Justina A, NP   650 mg at 09/30/17 1301  . alum & mag hydroxide-simeth (MAALOX/MYLANTA) 200-200-20 MG/5ML suspension 30 mL  30 mL Oral Q4H PRN Okonkwo, Justina A, NP      . feeding supplement (ENSURE  ENLIVE) (ENSURE ENLIVE) liquid 237 mL  237 mL Oral BID BM Izediuno, Vincent A, MD   237 mL at 09/30/17 1450  . gabapentin (NEURONTIN) capsule 400 mg  400 mg Oral TID Money, Gerlene Burdock, FNP   400 mg at 09/30/17 1713  . hydrOXYzine (ATARAX/VISTARIL) tablet 25 mg  25 mg Oral TID PRN Ferne Reus A, NP   25 mg at 09/30/17 1452  . magnesium hydroxide (MILK OF MAGNESIA) suspension 30 mL  30 mL Oral Daily PRN Okonkwo, Justina A, NP      . mirtazapine (REMERON) tablet 15 mg  15 mg Oral QHS Izediuno, Delight Ovens, MD   15 mg at 09/29/17 2152  . mirtazapine (REMERON) tablet 15 mg  15 mg Oral Once Donell Sievert E, PA-C      . neomycin-bacitracin-polymyxin (NEOSPORIN) ointment   Topical PRN Armandina Stammer I, NP      . nicotine (NICODERM CQ - dosed in mg/24 hours) patch 21 mg  21 mg Transdermal Daily Izediuno, Delight Ovens, MD   21 mg at 09/30/17 0909  . PARoxetine (PAXIL) tablet 20 mg  20 mg Oral Daily Truman Hayward, FNP   20 mg at 09/30/17 4098   Lab Results:  No results found for this or any previous visit (from the past 48 hour(s)).  Blood Alcohol level:  Lab Results  Component Value Date   ETH 223 (H) 11/30/2011   Metabolic Disorder Labs: Lab Results  Component Value Date   HGBA1C 5.3 09/27/2017   MPG 105.41 09/27/2017   No results found for: PROLACTIN Lab Results  Component Value Date   CHOL 156 09/27/2017   TRIG 86 09/27/2017   HDL 37 (L) 09/27/2017   CHOLHDL 4.2 09/27/2017   VLDL 17 09/27/2017   LDLCALC 102 (H) 09/27/2017   Physical Findings: AIMS: Facial and Oral Movements Muscles of Facial Expression: None, normal Lips and Perioral Area: None, normal Jaw: None, normal Tongue: None, normal,Extremity Movements Upper (arms, wrists, hands, fingers): None, normal Lower (legs, knees, ankles, toes): None, normal, Trunk Movements Neck, shoulders, hips: None, normal, Overall Severity Severity of abnormal movements (highest score from questions above): None, normal Incapacitation due to  abnormal movements: None, normal Patient's awareness of abnormal movements (rate only patient's report): No Awareness, Dental Status Current problems with teeth and/or dentures?: No Does patient usually wear dentures?: No  CIWA:    COWS:     Musculoskeletal: Strength & Muscle Tone: within normal limits Gait & Station: normal Patient leans: N/A  Psychiatric Specialty Exam: Physical Exam  Vitals reviewed. Constitutional: He is oriented to person, place, and time. He appears well-developed and well-nourished.  Respiratory: Effort normal.  Musculoskeletal: Normal range of motion.  Neurological: He is oriented to person, place, and time.  Skin: Skin is warm.    Review of Systems  Constitutional: Negative.   HENT: Negative.   Eyes: Negative.   Respiratory: Negative.  Cardiovascular: Negative.   Gastrointestinal: Negative.   Genitourinary: Negative.   Musculoskeletal: Negative.   Skin:       Multiple lacerations on upper extremities  Neurological: Negative.   Psychiatric/Behavioral: Positive for depression. Negative for hallucinations and suicidal ideas. The patient is nervous/anxious.     Blood pressure 112/74, pulse 89, temperature 98.5 F (36.9 C), temperature source Oral, resp. rate 20, height 6' 0.5" (1.842 m), weight 76.3 kg (168 lb 4 oz).Body mass index is 22.51 kg/m.  General Appearance: Casual  Eye Contact:  Good  Speech:  Clear and Coherent and Normal Rate  Volume:  Normal  Mood:  Euthymic  Affect:  Congruent  Thought Process:  Goal Directed and Descriptions of Associations: Intact  Orientation:  Full (Time, Place, and Person)  Thought Content:  WDL  Suicidal Thoughts:  No  Homicidal Thoughts:  No  Memory:  Immediate;   Good Recent;   Good Remote;   Good  Judgement:  Good  Insight:  Good  Psychomotor Activity:  Normal  Concentration:  Concentration: Good and Attention Span: Good  Recall:  Good  Fund of Knowledge:  Good  Language:  Good  Akathisia:  No   Handed:  Right  AIMS (if indicated):     Assets:  Communication Skills Desire for Improvement Financial Resources/Insurance Physical Health Social Support  ADL's:  Intact  Cognition:  WNL  Sleep:  Number of Hours: 5.25   Problems Addressed: MDD severe  Treatment Plan Summary: Daily contact with patient to assess and evaluate symptoms and progress in treatment, Medication management and Plan is to:   Agitation.   -  Increased Gabapentin from 400 mg PO TID to gabapentin 400 mg Qid for agitation.  Anxiety.    - Increased Vistaril from 25 mg to 50 PO TID PRN.    - Initiated Vistaril 50 mg po Q hs for insomnia.  Depression/insomnia.    - Continue Remeron 15 mg PO Q HS.    - Continue Paxil 20 mg po daily.  Nicotine withdrawal.    - Continue Nicotine patch 21 mg transdermally Q 24 hrs.     - Encourage group therapy participation    - Discharge plan ongoing.  Armandina Stammer, NP, PMHNP, FNP-BC. 09/30/2017, 5:18 PMPatient ID: Allen Brown, male   DOB: Jan 24, 1976, 42 y.o.   MRN: [id number]

## 2017-09-30 NOTE — Progress Notes (Signed)
Patient ID: Isla PenceJeremy Cleary, male   DOB: Aug 17, 1976, 42 y.o.   MRN: [id number]  Pt currently presents with an animated affect and hyperactive behavior. Pt seen pacing the hallways tonight. Pt reports to Clinical research associatewriter that their goal is to "go home tomorrow." Pt states "I have a plan, I just have to figure out who will get me." Pt reports intermittent sleep with current medication regimen, new regimen includes increased dosage.   Pt provided with medications per providers orders. Pt's labs and vitals were monitored throughout the night. Pt given a 1:1 about emotional and mental status. Pt supported and encouraged to express concerns and questions. Pt educated on medications and suicide prevention precautions.   Pt's safety ensured with 15 minute and environmental checks. Pt currently denies SI/HI and A/V hallucinations. Pt verbally agrees to seek staff if SI/HI or A/VH occurs and to consult with staff before acting on any harmful thoughts. Will continue POC.

## 2017-09-30 NOTE — [facility] Group Notes (Signed)
[facility] Group Notes:  (Nursing/MHT/Case Management/Adjunct)  Date:  09/30/2017  Time:  2:44 PM  Type of Therapy:  Psychoeducational Skills  Participation Level:  Active  Participation Quality:  Appropriate  Affect:  Appropriate  Cognitive:  Appropriate  Insight:  Appropriate  Engagement in Group:  Engaged  Modes of Intervention:  Education  Summary of Progress/Problems: Pt attended goals group.   Rich BraveDuke, Ava Deguire Lynn 09/30/2017, 2:44 PM

## 2017-10-01 DIAGNOSIS — F1290 Cannabis use, unspecified, uncomplicated: Secondary | ICD-10-CM

## 2017-10-01 MED ORDER — HYDROXYZINE HCL 50 MG PO TABS: 50.0000 mg | ORAL_TABLET | Freq: Three times a day (TID) | ORAL | 0 refills | Status: AC

## 2017-10-01 MED ORDER — GABAPENTIN 400 MG PO CAPS: 400.0000 mg | ORAL_CAPSULE | Freq: Three times a day (TID) | ORAL | 0 refills | Status: AC

## 2017-10-01 MED ORDER — MIRTAZAPINE 15 MG PO TABS: 15.0000 mg | ORAL_TABLET | Freq: Every day | ORAL | 0 refills | Status: AC

## 2017-10-01 MED ORDER — PAROXETINE HCL 20 MG PO TABS: 20.0000 mg | ORAL_TABLET | Freq: Every day | ORAL | 0 refills | Status: AC

## 2017-10-01 NOTE — Progress Notes (Signed)
  [facility] Discharge [facility] :  Will you be returning to the same living situation after discharge:  No. Pt will be "getting his own place" and reports no housing issues. At discharge, do you have transportation home?: Yes,  wife Do you have the ability to pay for your medications: No. Will work with [facility].  Release of information consent forms completed and in the chart;  Patient's signature needed at discharge.  Patient to Follow up at: Follow-up Information    [facility]. Go on 10/02/2017.   Why:  Please attend your appt at [facility] on Tuesday, 10/02/17, at 9:45am.  Please bring social security card, photo ID, any insurance, and proof of income (pay stub or other documentation) Contact information: [address] [place] [id number] [phone]           Next level of care provider [facility] Link:no  Safety Planning and Suicide Prevention discussed: Yes,  daughter  Have you used any form of tobacco in the last 30 days? (Cigarettes, Smokeless Tobacco, Cigars, and/or Pipes): Yes  Has patient been referred to the Quitline?: Patient refused referral  Patient has been referred for addiction treatment: Yes  Lorri FrederickWierda, Noble Bodie Jon, LCSW 10/01/2017, 11:39 AM

## 2017-10-01 NOTE — Progress Notes (Signed)
Discharge note:  Patient discharged home per MD order.  Patient received all personal belongings from unit and locker.  Reviewed AVS/transition record and he indicated understanding.  He denies any thoughts of self harm.  [facility]  Patient left ambulatory with his wife.

## 2017-10-01 NOTE — Plan of Care (Signed)
Completed/Met Activity: Interest or engagement in activities will improve 10/01/2017 0911 - Completed/Met by Joice Lofts, RN Sleeping patterns will improve 10/01/2017 0911 - Completed/Met by Joice Lofts, RN Education: Knowledge of [facility] Education information/materials will improve 10/01/2017 0911 - Completed/Met by Joice Lofts, RN Emotional status will improve 10/01/2017 0911 - Completed/Met by Joice Lofts, RN Mental status will improve 10/01/2017 0911 - Completed/Met by Joice Lofts, RN Verbalization of understanding the information provided will improve 10/01/2017 0911 - Completed/Met by Joice Lofts, RN Coping: Ability to verbalize frustrations and anger appropriately will improve 10/01/2017 0911 - Completed/Met by Joice Lofts, RN Ability to demonstrate self-control will improve 10/01/2017 0911 - Completed/Met by Joice Lofts, RN Health Behavior/Discharge Planning: Identification of resources available to assist in meeting health care needs will improve 10/01/2017 0911 - Completed/Met by Joice Lofts, RN Compliance with treatment plan for underlying cause of condition will improve 10/01/2017 0911 - Completed/Met by Joice Lofts, RN Physical Regulation: Ability to maintain clinical measurements within normal limits will improve 10/01/2017 0911 - Completed/Met by Joice Lofts, RN Safety: Periods of time without injury will increase 10/01/2017 0911 - Completed/Met by Joice Lofts, RN Activity: Will identify at least one activity in which they can participate 10/01/2017 0911 - Completed/Met by Joice Lofts, RN Coping: Ability to identify and develop effective coping behavior will improve 10/01/2017 0911 - Completed/Met by Joice Lofts, RN Ability to interact with others will improve 10/01/2017 0911 - Completed/Met by Joice Lofts, RN Participation in decision-making will  improve 10/01/2017 0911 - Completed/Met by Joice Lofts, RN Ability to use eye contact when communicating with others will improve 10/01/2017 0911 - Completed/Met by Joice Lofts, [place] Behavior/Discharge Planning: Identification of resources available to assist in meeting health care needs will improve 10/01/2017 0911 - Completed/Met by Joice Lofts, RN Self-Concept: Ability to verbalize positive feelings about self will improve 10/01/2017 0911 - Completed/Met by Joice Lofts, RN Education: Ability to make informed decisions regarding treatment will improve 10/01/2017 0911 - Completed/Met by Joice Lofts, RN Coping: Ability to cope will improve 10/01/2017 0911 - Completed/Met by Joice Lofts, RN Health Behavior/Discharge Planning: Identification of resources available to assist in meeting health care needs will improve 10/01/2017 0911 - Completed/Met by Joice Lofts, RN Medication: Compliance with prescribed medication regimen will improve 10/01/2017 0911 - Completed/Met by Joice Lofts, RN Self-Concept: Ability to disclose and discuss suicidal ideas will improve 10/01/2017 0911 - Completed/Met by Joice Lofts, RN Ability to verbalize positive feelings about self will improve 10/01/2017 0911 - Completed/Met by Joice Lofts, RN Activity: Interest or engagement in leisure activities will improve 10/01/2017 0911 - Completed/Met by Joice Lofts, RN Imbalance in normal sleep/wake cycle will improve 10/01/2017 0911 - Completed/Met by Joice Lofts, RN Education: Utilization of techniques to improve thought processes will improve 10/01/2017 0911 - Completed/Met by Joice Lofts, RN Knowledge of the prescribed therapeutic regimen will improve 10/01/2017 0911 - Completed/Met by Joice Lofts, RN Coping: Ability to cope will improve 10/01/2017 0911 - Completed/Met by Joice Lofts, RN Ability to verbalize  feelings will improve 10/01/2017 0911 - Completed/Met by Joice Lofts, [place] Behavior/Discharge Planning: Ability to make decisions will improve 10/01/2017 0911 - Completed/Met by Joice Lofts, RN Compliance with therapeutic regimen will improve 10/01/2017 0911 - Completed/Met by Joice Lofts, RN Role Relationship: Ability to demonstrate positive changes  in social behaviors and relationships will improve 10/01/2017 0911 - Completed/Met by Joice Lofts, RN Safety: Ability to disclose and discuss suicidal ideas will improve 10/01/2017 0911 - Completed/Met by Joice Lofts, RN Ability to identify and utilize support systems that promote safety will improve 10/01/2017 0911 - Completed/Met by Joice Lofts, RN Self-Concept: Ability to verbalize positive feelings about self will improve 10/01/2017 0911 - Completed/Met by Joice Lofts, RN Level of anxiety will decrease 10/01/2017 0911 - Completed/Met by Joice Lofts, RN Education: Ability to state activities that reduce stress will improve 10/01/2017 0911 - Completed/Met by Joice Lofts, RN Coping: Ability to identify and develop effective coping behavior will improve 10/01/2017 0911 - Completed/Met by Joice Lofts, RN Self-Concept: Ability to identify factors that promote anxiety will improve 10/01/2017 0911 - Completed/Met by Joice Lofts, RN Level of anxiety will decrease 10/01/2017 0911 - Completed/Met by Joice Lofts, RN Ability to modify response to factors that promote anxiety will improve 10/01/2017 0911 - Completed/Met by Joice Lofts, RN Spiritual Needs Ability to function at adequate level 10/01/2017 0911 - Completed/Met by Joice Lofts, RN
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
## 2017-10-01 NOTE — [facility] Suicide Risk Assessment (Signed)
[facility] Discharge Suicide Risk Assessment   Principal Problem: MDD (major depressive disorder), recurrent severe, without psychosis (HCC) Discharge Diagnoses:  Substance Induced Mood Disorder Patient Active Problem List   Diagnosis Date Noted  . MDD (major depressive disorder) [F32.9] 09/26/2017  . Substance use disorder [F19.90] 05/10/2017  . MDD (major depressive disorder), recurrent severe, without psychosis (HCC) [F33.2] 05/09/2017    Total Time spent with patient: 45 minutes  Musculoskeletal: Strength & Muscle Tone: within normal limits Gait & Station: normal Patient leans: N/A  Psychiatric Specialty Exam: Review of Systems  Constitutional: Negative.   HENT: Negative.   Eyes: Negative.   Respiratory: Negative.   Cardiovascular: Negative.   Gastrointestinal: Negative.   Genitourinary: Negative.   Musculoskeletal: Negative.   Skin: Negative.   Neurological: Negative.   Endo/Heme/Allergies: Negative.   Psychiatric/Behavioral: Negative for depression, hallucinations, memory loss and suicidal ideas. The patient is not nervous/anxious and does not have insomnia.     Blood pressure 130/77, pulse 97, temperature 98.1 F (36.7 C), temperature source Oral, resp. rate 18, height 6' 0.5" (1.842 m), weight 76.3 kg (168 lb 4 oz).Body mass index is 22.51 kg/m.  General Appearance: Neatly dressed, pleasant, engaging well and cooperative. Appropriate behavior. Not in any distress. Good relatedness. Not internally stimulated  Eye Contact::  Good  Speech:  Spontaneous, normal prosody. Normal tone and rate.   Volume:  Normal  Mood:  Euthymic  Affect:  Appropriate and Full Range  Thought Process:  Goal Directed  Orientation:  Full (Time, Place, and Person)  Thought Content:  Future oriented. No delusional theme. No preoccupation with violent thoughts. No negative ruminations. No obsession.  No hallucination in any modality.   Suicidal Thoughts:  No  Homicidal Thoughts:  No  Memory:   Immediate;   Good Recent;   Good Remote;   Good  Judgement:  Good  Insight:  Fair  Psychomotor Activity:  Normal  Concentration:  Good  Recall:  Good  Fund of Knowledge:Good  Language: Good  Akathisia:  Negative  Handed:    AIMS (if indicated):     Assets:  Communication Skills Desire for Improvement Physical Health Resilience Social Support Vocational/Educational  Sleep:  Number of Hours: 5.25  Cognition: WNL  ADL's:  Intact   Clinical Assessment::   4I y.o Caucasian male, homeless, recently separated from long term girlfriend a few days ago. Transferred from [facility]. Presented voluntarily in company of his daughter. Was Involuntarily committed there as her family had concerns about his personal safety. He is reported to have been actively searching for a gun. He expressed plans to kill himself. He has been off his antidepressant medication for several weeks. He has been drinking lately. BAL 21 mg/dl. UDS is negative  Seen today. Says he feels ready to go back into his routines. He has completely come off alcohol. He repeatedly denied any homicidal or suicidal thoughts. Tells me again today that his daughter was concerned about his drinking and used that as a means to get him admitted. Patient says he has been back and forth with his girlfriend over the years. Says they have been through this path in the past. He still feels they would likely get back together. Says he has nothing agains her new boyfriend. He has no intention of harming him or anyone. Reports that he is in good spirits. Not feeling depressed. Reports normal energy and interest. Has been maintaining normal biological functions. He is able to think clearly. He is able to focus  on task. His thoughts are not crowded or racing. No evidence of mania. No hallucination in any modality. He is not making any delusional statement. No passivity of will/thought. He is fully in touch with reality. No thoughts of suicide. No thoughts  of homicide. No violent thoughts. No overwhelming anxiety. No access to weapons.   Nursing staff reports that patient has been appropriate on the unit. Patient has been interacting well with peers. No behavioral issues. Patient has not voiced any suicidal thoughts. Patient has not been observed to be internally stimulated. Patient has been adherent with treatment recommendations. Patient has been tolerating their medication well.   Patient was discussed at team. Team members feels that patient is back to his baseline level of function. Team agrees with plan to discharge patient today.   Demographic Factors:  Caucasian  Loss Factors: NA  Historical Factors: Impulsivity  Risk Reduction Factors:   Sense of responsibility to family, Employed, Living with another person, especially a relative, Positive social support, Positive therapeutic relationship and Positive coping skills or problem solving skills  Continued Clinical Symptoms:   as above   Cognitive Features That Contribute To Risk:  None    Suicide Risk:  Minimal: No identifiable suicidal ideation. Patient is not having any thoughts of suicide at this time. Modifiable risk factors targeted during this admission includes depression and substance use. Demographical and historical risk factors cannot be modified. Patient is now engaging well. Patient is reliable and is future oriented. We have buffered patient's support structures. At this point, patient is at low risk of suicide. Patient is aware of the effects of psychoactive substances on decision making process. Patient has been provided with emergency contacts. Patient acknowledges to use resources provided if unforseen circumstances changes their current risk stratification.   Follow-up Information    [facility] Go on 10/03/2017.   Why:  [facility] Wednesday, 10/01/17, at 1:45pm.  Please bring social security card, photo ID, any  insurance, and proof of income (pay stub or other documentation) Contact information: [address] [place] [address] [phone]           Plan Of Care/Follow-up recommendations:  1. Continue current psychotropic medications 2. Mental health and addiction follow up as arranged.  3. Discharge in care of his family 4. Provided limited quantity of prescriptions  Georgiann CockerVincent A Myonna Chisom, MD 10/01/2017, 11:05 AM

## 2017-10-01 NOTE — Progress Notes (Signed)
Recreation Therapy Notes  Date: 10/01/17 Time: 0930 Location: [address] Dayroom  Group Topic: Stress Management  Goal Area(s) Addresses:  Patient will verbalize importance of using healthy stress management.  Patient will identify positive emotions associated with healthy stress management.   Intervention: Stress Management  Activity :  Gratitude Meditation.  LRT played meditation from Calm app to allow patients to meditate on being grateful for the things, skills or people in their lives that have had a positive effect on their lives.  Education: Stress Management, Discharge Planning.   Education Outcome: Acknowledges edcuation/In group clarification offered/Needs additional education  Clinical Observations/Feedback: Pt did not attend group.    Thanvi Blincoe Linday, LRT/CTRS         Cotton Beckley A 10/01/2017 12:36 PM 

## 2017-10-15 DIAGNOSIS — G8929 Other chronic pain: Secondary | ICD-10-CM

## 2017-10-15 DIAGNOSIS — F10239 Alcohol dependence with withdrawal, unspecified: Secondary | ICD-10-CM

## 2017-10-15 DIAGNOSIS — R45851 Suicidal ideations: Secondary | ICD-10-CM

## 2017-10-15 DIAGNOSIS — I10 Essential (primary) hypertension: Secondary | ICD-10-CM

## 2017-10-15 DIAGNOSIS — F17210 Nicotine dependence, cigarettes, uncomplicated: Secondary | ICD-10-CM

## 2017-10-15 DIAGNOSIS — F418 Other specified anxiety disorders: Secondary | ICD-10-CM

## 2017-10-15 DIAGNOSIS — M545 Low back pain: Secondary | ICD-10-CM

## 2017-10-15 DIAGNOSIS — F10229 Alcohol dependence with intoxication, unspecified: Secondary | ICD-10-CM

## 2017-10-16 DIAGNOSIS — M545 Low back pain: Secondary | ICD-10-CM

## 2017-10-17 DIAGNOSIS — M545 Low back pain: Secondary | ICD-10-CM
